# Patient Record
Sex: Female | Born: 1945 | Race: Black or African American | Hispanic: No | Marital: Single | Smoking: Never smoker
Health system: Southern US, Community
[De-identification: ages and names within clinical notes are randomized; demographics above are authoritative.]

## PROBLEM LIST (undated history)

## (undated) DIAGNOSIS — F0390 Unspecified dementia without behavioral disturbance: Secondary | ICD-10-CM

## (undated) DIAGNOSIS — M109 Gout, unspecified: Secondary | ICD-10-CM

## (undated) DIAGNOSIS — I10 Essential (primary) hypertension: Secondary | ICD-10-CM

---

## 1997-11-22 ENCOUNTER — Ambulatory Visit (HOSPITAL_COMMUNITY): Admission: RE | Admit: 1997-11-22 | Discharge: 1997-11-22 | Payer: Self-pay | Admitting: *Deleted

## 1997-12-13 ENCOUNTER — Ambulatory Visit (HOSPITAL_COMMUNITY): Admission: RE | Admit: 1997-12-13 | Discharge: 1997-12-13 | Payer: Self-pay | Admitting: *Deleted

## 1998-03-26 ENCOUNTER — Encounter: Payer: Self-pay | Admitting: Emergency Medicine

## 1998-03-26 ENCOUNTER — Encounter: Payer: Self-pay | Admitting: *Deleted

## 1998-03-26 ENCOUNTER — Inpatient Hospital Stay (HOSPITAL_COMMUNITY): Admission: EM | Admit: 1998-03-26 | Discharge: 1998-03-30 | Payer: Self-pay | Admitting: Emergency Medicine

## 1998-09-19 ENCOUNTER — Ambulatory Visit (HOSPITAL_COMMUNITY): Admission: RE | Admit: 1998-09-19 | Discharge: 1998-09-19 | Payer: Self-pay | Admitting: Family Medicine

## 1998-09-25 ENCOUNTER — Ambulatory Visit (HOSPITAL_COMMUNITY): Admission: RE | Admit: 1998-09-25 | Discharge: 1998-09-25 | Payer: Self-pay | Admitting: Family Medicine

## 1998-09-27 ENCOUNTER — Other Ambulatory Visit: Admission: RE | Admit: 1998-09-27 | Discharge: 1998-09-27 | Payer: Self-pay | Admitting: Family Medicine

## 1999-05-16 ENCOUNTER — Encounter: Payer: Self-pay | Admitting: Family Medicine

## 1999-05-16 ENCOUNTER — Ambulatory Visit (HOSPITAL_COMMUNITY): Admission: RE | Admit: 1999-05-16 | Discharge: 1999-05-16 | Payer: Self-pay | Admitting: Family Medicine

## 2000-11-05 ENCOUNTER — Ambulatory Visit (HOSPITAL_COMMUNITY): Admission: RE | Admit: 2000-11-05 | Discharge: 2000-11-05 | Payer: Self-pay | Admitting: Family Medicine

## 2000-11-05 ENCOUNTER — Encounter: Payer: Self-pay | Admitting: Family Medicine

## 2001-11-10 ENCOUNTER — Encounter: Payer: Self-pay | Admitting: Family Medicine

## 2001-11-10 ENCOUNTER — Encounter: Admission: RE | Admit: 2001-11-10 | Discharge: 2001-11-10 | Payer: Self-pay | Admitting: Family Medicine

## 2002-10-08 ENCOUNTER — Encounter: Admission: RE | Admit: 2002-10-08 | Discharge: 2002-10-08 | Payer: Self-pay | Admitting: Family Medicine

## 2002-10-08 ENCOUNTER — Encounter: Payer: Self-pay | Admitting: Family Medicine

## 2003-03-15 ENCOUNTER — Ambulatory Visit (HOSPITAL_COMMUNITY): Admission: RE | Admit: 2003-03-15 | Discharge: 2003-03-15 | Payer: Self-pay | Admitting: Gastroenterology

## 2003-03-15 ENCOUNTER — Encounter (INDEPENDENT_AMBULATORY_CARE_PROVIDER_SITE_OTHER): Payer: Self-pay | Admitting: *Deleted

## 2004-01-05 ENCOUNTER — Encounter: Admission: RE | Admit: 2004-01-05 | Discharge: 2004-01-05 | Payer: Self-pay | Admitting: Family Medicine

## 2005-04-03 ENCOUNTER — Encounter: Admission: RE | Admit: 2005-04-03 | Discharge: 2005-04-03 | Payer: Self-pay | Admitting: Family Medicine

## 2006-06-03 ENCOUNTER — Encounter: Admission: RE | Admit: 2006-06-03 | Discharge: 2006-06-03 | Payer: Self-pay | Admitting: Family Medicine

## 2006-06-17 ENCOUNTER — Encounter: Admission: RE | Admit: 2006-06-17 | Discharge: 2006-06-17 | Payer: Self-pay | Admitting: Family Medicine

## 2007-09-18 ENCOUNTER — Emergency Department (HOSPITAL_COMMUNITY): Admission: EM | Admit: 2007-09-18 | Discharge: 2007-09-18 | Payer: Self-pay | Admitting: Emergency Medicine

## 2007-09-30 ENCOUNTER — Encounter: Admission: RE | Admit: 2007-09-30 | Discharge: 2007-09-30 | Payer: Self-pay | Admitting: Family Medicine

## 2009-02-08 ENCOUNTER — Encounter: Admission: RE | Admit: 2009-02-08 | Discharge: 2009-02-08 | Payer: Self-pay | Admitting: Family Medicine

## 2009-05-11 ENCOUNTER — Ambulatory Visit (HOSPITAL_COMMUNITY): Admission: RE | Admit: 2009-05-11 | Discharge: 2009-05-11 | Payer: Self-pay | Admitting: Gastroenterology

## 2010-05-27 ENCOUNTER — Encounter: Payer: Self-pay | Admitting: Family Medicine

## 2014-05-26 ENCOUNTER — Emergency Department (HOSPITAL_COMMUNITY)
Admission: EM | Admit: 2014-05-26 | Discharge: 2014-05-26 | Disposition: A | Discharge status: Home / Self Care | Payer: Medicare Other | Attending: Emergency Medicine | Admitting: Emergency Medicine

## 2014-05-26 ENCOUNTER — Encounter (HOSPITAL_COMMUNITY): Payer: Self-pay | Admitting: *Deleted

## 2014-05-26 DIAGNOSIS — L089 Local infection of the skin and subcutaneous tissue, unspecified: Secondary | ICD-10-CM | POA: Diagnosis present

## 2014-05-26 DIAGNOSIS — H6002 Abscess of left external ear: Secondary | ICD-10-CM | POA: Diagnosis not present

## 2014-05-26 MED ORDER — LIDOCAINE HCL 2 % IJ SOLN: 5.0000 mL | Freq: Once | INTRAMUSCULAR | Status: DC

## 2014-05-26 MED ORDER — LIDOCAINE HCL 2 % IJ SOLN
5.0000 mL | Freq: Once | INTRAMUSCULAR | Status: AC
  Administered 2014-05-26: 100 mg
  Filled 2014-05-26: qty 20

## 2014-05-26 MED ORDER — ACETAMINOPHEN 325 MG PO TABS: 650.0000 mg | ORAL_TABLET | Freq: Four times a day (QID) | ORAL | Status: AC | PRN

## 2014-05-26 NOTE — ED Provider Notes (Signed)
CSN: [id number]     Arrival date & time 05/26/14  1104 History   First MD Initiated Contact with Patient 05/26/14 1129     Chief Complaint  Patient presents with  . Wound Infection     (Consider location/radiation/quality/duration/timing/severity/associated sxs/prior Treatment) HPI   69 year old female who was sent [facility] from [facility] for evaluation of possible earlobe infection. Per nursing note, patient has had complaints of pain to her left ear for an unknown amount of time. Nurses notice green pus coming out of of her upper outer ear and therefore patient was sent here for further evaluation. History was difficult to obtain as patient is not a good historian. She does complaining of pus drainage to the affected side and also complaining of ear pain but denies any hearing loss. Unsure of her tetanus status. She denies fever.  History reviewed. No pertinent past medical history. History reviewed. No pertinent past surgical history. No family history on file. History  Substance Use Topics  . Smoking status: Never Smoker   . Smokeless tobacco: Not on file  . Alcohol Use: No   OB History    No data available     Review of Systems  Constitutional: Negative for fever.  HENT: Positive for ear pain. Negative for hearing loss.   Skin: Negative for rash.      Allergies  Review of patient's allergies indicates not on file.  Home Medications   Prior to Admission medications   Not on File   BP 139/75 mmHg  Pulse 79  Temp(Src) 97.5 F (36.4 C) (Oral)  Resp 18  SpO2 95% Physical Exam  Constitutional: She appears well-developed and well-nourished. No distress.  HENT:  Head: Atraumatic.  Left ear: An area of induration and fluctuance noted to the leg of helix with a small punctate area actively oozing out pustular discharge. Normal ear canal with moderate amount of cerumen however TM is intact and normal appearance.  Eyes: Conjunctivae are normal.   Neck: Neck supple.  Neurological: She is alert.  Skin: No rash noted.  Psychiatric: She has a normal mood and affect.  Nursing note and vitals reviewed.   ED Course  Procedures (including critical care time)  12:26 PM Superficial abscess to L ear lobe at leg of helix, successfully I&D by me with relief of sxs.    INCISION AND DRAINAGE Performed by: Fayrene HelperRAN,Alany Borman Consent: Verbal consent obtained. Risks and benefits: risks, benefits and alternatives were discussed Type: abscess  Body area: L ear lobe (leg of helix)  Anesthesia: local infiltration  Incision was made with a scalpel.  Local anesthetic: lidocaine 2% w/o epinephrine  Anesthetic total: 1 ml  Complexity: complex Blunt dissection to break up loculations  Drainage: purulent  Drainage amount: small  Packing material:   Patient tolerance: Patient tolerated the procedure well with no immediate complications.     Labs Review Labs Reviewed - No data to display  Imaging Review No results found.   EKG Interpretation None      MDM   Final diagnoses:  Abscess, earlobe, left    BP 139/75 mmHg  Pulse 79  Temp(Src) 97.5 F (36.4 C) (Oral)  Resp 18  SpO2 95%     Fayrene HelperBowie Santana Edell, PA-C 05/26/14 1305  Tilden FossaElizabeth Rees, MD 05/26/14 1312

## 2014-05-26 NOTE — ED Notes (Signed)
Pt from [facility], came to ED d/t possible infection left ear lobe.

## 2014-05-26 NOTE — ED Notes (Signed)
Note from [facility] states the pt has green pus coming from upper outer ear. Nursing home would like pt evaluated.

## 2014-05-26 NOTE — Discharge Instructions (Signed)
You have an abscess in your left ear lobe.  Please continue to apply warm moist compress to affected region several times daily.  Clean skin with dial antibacterial soap daily.  If your condition worsen, you may need to be seen by your regular doctor for antibiotic.  Take tylenol as needed for pain.   Abscess Care After An abscess (also called a boil or furuncle) is an infected area that contains a collection of pus. Signs and symptoms of an abscess include pain, tenderness, redness, or hardness, or you may feel a moveable soft area under your skin. An abscess can occur anywhere in the body. The infection may spread to surrounding tissues causing cellulitis. A cut (incision) by the surgeon was made over your abscess and the pus was drained out. Gauze may have been packed into the space to provide a drain that will allow the cavity to heal from the inside outwards. The boil may be painful for 5 to 7 days. Most people with a boil do not have high fevers. Your abscess, if seen early, may not have localized, and may not have been lanced. If not, another appointment may be required for this if it does not get better on its own or with medications. HOME CARE INSTRUCTIONS   Only take over-the-counter or prescription medicines for pain, discomfort, or fever as directed by your caregiver.  When you bathe, soak and then remove gauze or iodoform packs at least daily or as directed by your caregiver. You may then wash the wound gently with mild soapy water. Repack with gauze or do as your caregiver directs. SEEK IMMEDIATE MEDICAL CARE IF:   You develop increased pain, swelling, redness, drainage, or bleeding in the wound site.  You develop signs of generalized infection including muscle aches, chills, fever, or a general ill feeling.  An oral temperature above 102 F (38.9 C) develops, not controlled by medication. See your caregiver for a recheck if you develop any of the symptoms described above. If  medications (antibiotics) were prescribed, take them as directed. Document Released: 11/08/2004 Document Revised: 07/15/2011 Document Reviewed: 07/06/2007 [facility] Patient Information 2015 [place]. This information is not intended to replace advice given to you by your health care provider. Make sure you discuss any questions you have with your health care provider.

## 2014-05-26 NOTE — ED Notes (Signed)
Bed: [id number] Expected date:  Expected time:  Means of arrival:  Comments: Elderly, earlobe infection

## 2014-12-07 ENCOUNTER — Emergency Department (HOSPITAL_COMMUNITY): Payer: Medicare Other

## 2014-12-07 ENCOUNTER — Encounter (HOSPITAL_COMMUNITY): Payer: Self-pay | Admitting: Emergency Medicine

## 2014-12-07 ENCOUNTER — Emergency Department (HOSPITAL_COMMUNITY)
Admission: EM | Admit: 2014-12-07 | Discharge: 2014-12-07 | Disposition: A | Discharge status: Home / Self Care | Payer: Medicare Other | Attending: Emergency Medicine | Admitting: Emergency Medicine

## 2014-12-07 DIAGNOSIS — F0390 Unspecified dementia without behavioral disturbance: Secondary | ICD-10-CM | POA: Insufficient documentation

## 2014-12-07 DIAGNOSIS — Z7902 Long term (current) use of antithrombotics/antiplatelets: Secondary | ICD-10-CM | POA: Insufficient documentation

## 2014-12-07 DIAGNOSIS — Z79899 Other long term (current) drug therapy: Secondary | ICD-10-CM | POA: Diagnosis not present

## 2014-12-07 DIAGNOSIS — R109 Unspecified abdominal pain: Secondary | ICD-10-CM | POA: Insufficient documentation

## 2014-12-07 LAB — URINALYSIS, ROUTINE W REFLEX MICROSCOPIC
Bilirubin Urine: NEGATIVE
GLUCOSE, UA: NEGATIVE mg/dL
Hgb urine dipstick: NEGATIVE
KETONES UR: NEGATIVE mg/dL
NITRITE: NEGATIVE
PROTEIN: NEGATIVE mg/dL
SPECIFIC GRAVITY, URINE: 1.011 (ref 1.005–1.030)
Urobilinogen, UA: 0.2 mg/dL (ref 0.0–1.0)
pH: 7 (ref 5.0–8.0)

## 2014-12-07 LAB — CBC WITH DIFFERENTIAL/PLATELET
Basophils Absolute: 0 10*3/uL (ref 0.0–0.1)
Basophils Relative: 0 % (ref 0–1)
EOS ABS: 0.1 10*3/uL (ref 0.0–0.7)
EOS PCT: 2 % (ref 0–5)
HEMATOCRIT: 41.6 % (ref 36.0–46.0)
HEMOGLOBIN: 13.8 g/dL (ref 12.0–15.0)
LYMPHS ABS: 1.4 10*3/uL (ref 0.7–4.0)
Lymphocytes Relative: 30 % (ref 12–46)
MCH: 32.3 pg (ref 26.0–34.0)
MCHC: 33.2 g/dL (ref 30.0–36.0)
MCV: 97.4 fL (ref 78.0–100.0)
MONOS PCT: 10 % (ref 3–12)
Monocytes Absolute: 0.5 10*3/uL (ref 0.1–1.0)
Neutro Abs: 2.8 10*3/uL (ref 1.7–7.7)
Neutrophils Relative %: 58 % (ref 43–77)
Platelets: 171 10*3/uL (ref 150–400)
RBC: 4.27 MIL/uL (ref 3.87–5.11)
RDW: 14.6 % (ref 11.5–15.5)
WBC: 4.8 10*3/uL (ref 4.0–10.5)

## 2014-12-07 LAB — LIPASE, BLOOD: LIPASE: 315 U/L — AB (ref 22–51)

## 2014-12-07 LAB — COMPREHENSIVE METABOLIC PANEL
ALBUMIN: 3.9 g/dL (ref 3.5–5.0)
ALK PHOS: 128 U/L — AB (ref 38–126)
ALT: 19 U/L (ref 14–54)
ANION GAP: 8 (ref 5–15)
AST: 29 U/L (ref 15–41)
BILIRUBIN TOTAL: 0.6 mg/dL (ref 0.3–1.2)
BUN: 29 mg/dL — ABNORMAL HIGH (ref 6–20)
CALCIUM: 9.2 mg/dL (ref 8.9–10.3)
CHLORIDE: 104 mmol/L (ref 101–111)
CO2: 26 mmol/L (ref 22–32)
Creatinine, Ser: 1.11 mg/dL — ABNORMAL HIGH (ref 0.44–1.00)
GFR calc Af Amer: 57 mL/min — ABNORMAL LOW (ref >60)
GFR calc non Af Amer: 49 mL/min — ABNORMAL LOW (ref >60)
Glucose, Bld: 76 mg/dL (ref 65–99)
Potassium: 4 mmol/L (ref 3.5–5.1)
Sodium: 138 mmol/L (ref 135–145)
TOTAL PROTEIN: 6.9 g/dL (ref 6.5–8.1)

## 2014-12-07 LAB — URINE MICROSCOPIC-ADD ON

## 2014-12-07 NOTE — ED Provider Notes (Signed)
CSN: [id number]     Arrival date & time 12/07/14  1712 History   First MD Initiated Contact with Patient 12/07/14 1728     Chief Complaint  Patient presents with  . Abdominal Pain      HPI  Patient presents evaluation of possible headache, and possible abdominal pain. Patient's history of dementia. Resides today would like place memory care unit. Her power of attorney that accompanies her here received a call today initially she had had a headache. Didn't receive another call that she was "not herself" might be having abdominal pain".  On arrival here, patient complains only that her "eyes water a lot". By mouth he states that this is a constant complaint for her.  History reviewed. No pertinent past medical history. History reviewed. No pertinent past surgical history. No family history on file. History  Substance Use Topics  . Smoking status: Never Smoker   . Smokeless tobacco: Not on file  . Alcohol Use: No   OB History    No data available     Review of Systems  Unable to perform ROS: Dementia      Allergies  Review of patient's allergies indicates no known allergies.  Home Medications   Prior to Admission medications   Medication Sig Start Date End Date Taking? Authorizing Provider  amLODipine (NORVASC) 5 MG tablet Take 5 mg by mouth daily.   Yes Historical Provider, MD  cetaphil (CETAPHIL) cream Apply 1 application topically as needed (dry skin).   Yes Historical Provider, MD  clopidogrel (PLAVIX) 75 MG tablet Take 75 mg by mouth daily.   Yes Historical Provider, MD  dexlansoprazole (DEXILANT) 60 MG capsule Take 60 mg by mouth daily.   Yes Historical Provider, MD  fluocinonide cream (LIDEX) 0.05 % Apply 1 application topically 3 (three) times daily.   Yes Historical Provider, MD  losartan (COZAAR) 50 MG tablet Take 50 mg by mouth daily.   Yes Historical Provider, MD  memantine (NAMENDA) 10 MG tablet Take 10 mg by mouth daily.   Yes Historical Provider, MD   metFORMIN (GLUCOPHAGE) 500 MG tablet Take 500 mg by mouth at bedtime.   Yes Historical Provider, MD  QUEtiapine (SEROQUEL) 25 MG tablet Take 25 mg by mouth at bedtime.   Yes Historical Provider, MD  traMADol (ULTRAM) 50 MG tablet Take 50 mg by mouth every 6 (six) hours as needed for moderate pain.   Yes Historical Provider, MD  triamcinolone cream (KENALOG) 0.1 % Apply 1 application topically every 12 (twelve) hours as needed (dry skin).   Yes Historical Provider, MD  acetaminophen (TYLENOL) 325 MG tablet Take 2 tablets (650 mg total) by mouth every 6 (six) hours as needed for moderate pain or fever. Patient not taking: Reported on 12/07/2014 05/26/14   Fayrene Helper, PA-C   BP 125/78 mmHg  Pulse 79  Temp(Src) 98.9 F (37.2 C) (Oral)  Resp 16  SpO2 97% Physical Exam  Constitutional: She is oriented to person, place, and time. She appears well-developed and well-nourished. No distress.  HENT:  Head: Normocephalic.  Eyes: Conjunctivae are normal. Pupils are equal, round, and reactive to light. No scleral icterus.  Neck: Normal range of motion. Neck supple. No thyromegaly present.  Cardiovascular: Normal rate and regular rhythm.  Exam reveals no gallop and no friction rub.   No murmur heard. Pulmonary/Chest: Effort normal and breath sounds normal. No respiratory distress. She has no wheezes. She has no rales.  Abdominal: Soft. Bowel sounds are normal. She exhibits  no distension. There is no tenderness. There is no rebound.  Soft benign abdomen. No guarding rebound. Particular no left upper abdomen or epigastric pain.  Musculoskeletal: Normal range of motion.  Neurological: She is alert and oriented to person, place, and time.  Skin: Skin is warm and dry. No rash noted.  Psychiatric: She has a normal mood and affect. Her behavior is normal.    ED Course  Procedures (including critical care time) Labs Review Labs Reviewed  COMPREHENSIVE METABOLIC PANEL - Abnormal; Notable for the following:     BUN 29 (*)    Creatinine, Ser 1.11 (*)    Alkaline Phosphatase 128 (*)    GFR calc non Af Amer 49 (*)    GFR calc Af Amer 57 (*)    All other components within normal limits  LIPASE, BLOOD - Abnormal; Notable for the following:    Lipase 315 (*)    All other components within normal limits  URINALYSIS, ROUTINE W REFLEX MICROSCOPIC (NOT AT [facility]) - Abnormal; Notable for the following:    Leukocytes, UA MODERATE (*)    All other components within normal limits  CBC WITH DIFFERENTIAL/PLATELET  URINE MICROSCOPIC-ADD ON    Imaging Review Ct Head Wo Contrast  12/07/2014   CLINICAL DATA:  Alzheimer's disease.  RIGHT flank pain.  EXAM: CT HEAD WITHOUT CONTRAST  TECHNIQUE: Contiguous axial images were obtained from the base of the skull through the vertex without intravenous contrast.  COMPARISON:  None.  FINDINGS: No mass lesion, mass effect, midline shift, hydrocephalus, hemorrhage. No acute territorial cortical ischemia/infarct. Atrophy and chronic ischemic white matter disease is present. Motion artifact is present on the most inferior slice. Benign basal ganglia calcifications are present.  IMPRESSION: Atrophy and chronic ischemic white matter disease without acute intracranial abnormality.   Electronically Signed   By: Andreas Newport M.D.   On: 12/07/2014 19:37   Ct Renal Stone Study  12/07/2014   CLINICAL DATA:  Left-sided abdominal pain. History of Alzheimer's disease.  EXAM: CT ABDOMEN AND PELVIS WITHOUT CONTRAST  TECHNIQUE: Multidetector CT imaging of the abdomen and pelvis was performed following the standard protocol without IV contrast.  COMPARISON:  02/08/2009  FINDINGS: Lung bases demonstrate minimal dependent atelectatic change. Calcified plaque is present over the left main, left anterior descending and lateral circumflex coronary arteries.  Abdominal images demonstrate a tiny calcified granuloma over the right lobe of the liver. The spleen, pancreas, gallbladder and adrenal glands are  within normal.  Kidneys are normal in size without hydronephrosis or nephrolithiasis. Ureters are within normal.  Appendix is within normal.  There is moderate calcified plaque over the abdominal aorta.  There is mild fecal retention throughout the colon. Small bowel is within normal. There is no free fluid or inflammatory change.  Pelvic images demonstrate the bladder and rectum to be within normal. There are multiple phleboliths present. No free fluid.  There are degenerative changes of the spine and hips. There is stable sclerosis with prominent trabecular a throughout the entire L5 vertebral body and posterior elements suggesting Paget's disease. Similar changes are present with more prominent sclerosis involving L4, new versus moderate progression compared to the preop prior study.  IMPRESSION: No acute findings in the abdomen/ pelvis.  Mild fecal retention throughout the colon without evidence of obstruction.  Stable changes involving the L5 vertebral body with progression of similar changes involving the L4 vertebral body most suggestive of Paget's disease.  Atherosclerotic coronary artery disease.   Electronically Signed  By: Elberta Fortis M.D.   On: 12/07/2014 19:47     EKG Interpretation None      MDM   Final diagnoses:  Flank pain  Abdominal pain, unspecified abdominal location    Patient with benign abdomen. Requesting by mouth and taking by mouth without difficulty here. CT with no signs of pancreatitis. I discussed the elevation of her lipase with her provider that is accompanying her here. I think she is appropriate for discharge back to her facility. If she has complained of anorexia, nausea vomiting, worsening pain, fever, or other change in bastard to be reevaluated here. Clinically sided for morning. Cancer diet and tolerating.    Rolland Porter, MD 12/07/14 [phone]

## 2014-12-07 NOTE — ED Notes (Signed)
Bed: WU98 Expected date:  Expected time:  Means of arrival:  Comments: HA

## 2014-12-07 NOTE — Discharge Instructions (Signed)
Clear liquids only tonight and tomorrow morning. Slowly advance diet if tolerated. Recheck here with fever, abdominal pain, vomiting, or other worsening symptoms. Labs suggest a mild pancreatitis. However, CT scan does not show evidence of pancreatitis.  This may be simple lab abnormality. Abdominal Pain Many things can cause abdominal pain. Usually, abdominal pain is not caused by a disease and will improve without treatment. It can often be observed and treated at home. Your health care provider will do a physical exam and possibly order blood tests and X-rays to help determine the seriousness of your pain. However, in many cases, more time must pass before a clear cause of the pain can be found. Before that point, your health care provider may not know if you need more testing or further treatment. HOME CARE INSTRUCTIONS  Monitor your abdominal pain for any changes. The following actions may help to alleviate any discomfort you are experiencing:  Only take over-the-counter or prescription medicines as directed by your health care provider.  Do not take laxatives unless directed to do so by your health care provider.  Try a clear liquid diet (broth, tea, or water) as directed by your health care provider. Slowly move to a bland diet as tolerated. SEEK MEDICAL CARE IF:  You have unexplained abdominal pain.  You have abdominal pain associated with nausea or diarrhea.  You have pain when you urinate or have a bowel movement.  You experience abdominal pain that wakes you in the night.  You have abdominal pain that is worsened or improved by eating food.  You have abdominal pain that is worsened with eating fatty foods.  You have a fever. SEEK IMMEDIATE MEDICAL CARE IF:   Your pain does not go away within 2 hours.  You keep throwing up (vomiting).  Your pain is felt only in portions of the abdomen, such as the right side or the left lower portion of the abdomen.  You pass bloody or  black tarry stools. MAKE SURE YOU:  Understand these instructions.   Will watch your condition.   Will get help right away if you are not doing well or get worse.  Document Released: 01/30/2005 Document Revised: 04/27/2013 Document Reviewed: 12/30/2012 [facility] Patient Information 2015 [place], [place]. This information is not intended to replace advice given to you by your health care provider. Make sure you discuss any questions you have with your health care provider.

## 2014-12-07 NOTE — ED Notes (Signed)
Patient here from [facility] with complaints of left sided abd pain. Hx of Alzheimer's. Denies n/v/d.

## 2014-12-07 NOTE — Progress Notes (Signed)
Pt doctor assigned by Sandi Mariscal access is [facility] - Dr Amada Kingfisher bonsu EPIC updated

## 2014-12-07 NOTE — ED Notes (Signed)
Please call Burna Mortimer, pt's POA with test's results.  [phone]

## 2015-07-13 ENCOUNTER — Emergency Department (HOSPITAL_COMMUNITY)
Admission: EM | Admit: 2015-07-13 | Discharge: 2015-07-13 | Disposition: A | Discharge status: Home / Self Care | Payer: Medicare Other | Attending: Emergency Medicine | Admitting: Emergency Medicine

## 2015-07-13 ENCOUNTER — Emergency Department (HOSPITAL_COMMUNITY): Payer: Medicare Other

## 2015-07-13 ENCOUNTER — Encounter (HOSPITAL_COMMUNITY): Payer: Self-pay | Admitting: Emergency Medicine

## 2015-07-13 DIAGNOSIS — J069 Acute upper respiratory infection, unspecified: Secondary | ICD-10-CM | POA: Insufficient documentation

## 2015-07-13 DIAGNOSIS — H578 Other specified disorders of eye and adnexa: Secondary | ICD-10-CM | POA: Diagnosis present

## 2015-07-13 DIAGNOSIS — R109 Unspecified abdominal pain: Secondary | ICD-10-CM | POA: Insufficient documentation

## 2015-07-13 DIAGNOSIS — I10 Essential (primary) hypertension: Secondary | ICD-10-CM | POA: Diagnosis not present

## 2015-07-13 DIAGNOSIS — Z79899 Other long term (current) drug therapy: Secondary | ICD-10-CM | POA: Insufficient documentation

## 2015-07-13 DIAGNOSIS — Z7982 Long term (current) use of aspirin: Secondary | ICD-10-CM | POA: Diagnosis not present

## 2015-07-13 DIAGNOSIS — H109 Unspecified conjunctivitis: Secondary | ICD-10-CM | POA: Diagnosis not present

## 2015-07-13 DIAGNOSIS — R4182 Altered mental status, unspecified: Secondary | ICD-10-CM | POA: Insufficient documentation

## 2015-07-13 HISTORY — DX: Essential (primary) hypertension: I10

## 2015-07-13 LAB — DIFFERENTIAL
BASOS ABS: 0 10*3/uL (ref 0.0–0.1)
BASOS PCT: 0 %
EOS ABS: 0 10*3/uL (ref 0.0–0.7)
Eosinophils Relative: 0 %
Lymphocytes Relative: 19 %
Lymphs Abs: 0.9 10*3/uL (ref 0.7–4.0)
MONOS PCT: 13 %
Monocytes Absolute: 0.6 10*3/uL (ref 0.1–1.0)
NEUTROS PCT: 68 %
Neutro Abs: 3.2 10*3/uL (ref 1.7–7.7)

## 2015-07-13 LAB — COMPREHENSIVE METABOLIC PANEL
ALK PHOS: 98 U/L (ref 38–126)
ALT: 36 U/L (ref 14–54)
ANION GAP: 15 (ref 5–15)
AST: 56 U/L — ABNORMAL HIGH (ref 15–41)
Albumin: 4.7 g/dL (ref 3.5–5.0)
BILIRUBIN TOTAL: 0.8 mg/dL (ref 0.3–1.2)
BUN: 27 mg/dL — ABNORMAL HIGH (ref 6–20)
CALCIUM: 10.1 mg/dL (ref 8.9–10.3)
CO2: 30 mmol/L (ref 22–32)
Chloride: 96 mmol/L — ABNORMAL LOW (ref 101–111)
Creatinine, Ser: 1.04 mg/dL — ABNORMAL HIGH (ref 0.44–1.00)
GFR calc non Af Amer: 53 mL/min — ABNORMAL LOW (ref >60)
Glucose, Bld: 89 mg/dL (ref 65–99)
Potassium: 3.9 mmol/L (ref 3.5–5.1)
SODIUM: 141 mmol/L (ref 135–145)
TOTAL PROTEIN: 8.7 g/dL — AB (ref 6.5–8.1)

## 2015-07-13 LAB — URINALYSIS, ROUTINE W REFLEX MICROSCOPIC
Bilirubin Urine: NEGATIVE
Glucose, UA: NEGATIVE mg/dL
Ketones, ur: NEGATIVE mg/dL
LEUKOCYTES UA: NEGATIVE
Nitrite: NEGATIVE
PROTEIN: 30 mg/dL — AB
Specific Gravity, Urine: 1.013 (ref 1.005–1.030)
pH: 6 (ref 5.0–8.0)

## 2015-07-13 LAB — CBG MONITORING, ED
GLUCOSE-CAPILLARY: 75 mg/dL (ref 65–99)
GLUCOSE-CAPILLARY: 83 mg/dL (ref 65–99)

## 2015-07-13 LAB — URINE MICROSCOPIC-ADD ON: Bacteria, UA: NONE SEEN

## 2015-07-13 LAB — CBC
HCT: 44.6 % (ref 36.0–46.0)
HEMOGLOBIN: 15.3 g/dL — AB (ref 12.0–15.0)
MCH: 32.2 pg (ref 26.0–34.0)
MCHC: 34.3 g/dL (ref 30.0–36.0)
MCV: 93.9 fL (ref 78.0–100.0)
Platelets: 158 10*3/uL (ref 150–400)
RBC: 4.75 MIL/uL (ref 3.87–5.11)
RDW: 14.3 % (ref 11.5–15.5)
WBC: 4.7 10*3/uL (ref 4.0–10.5)

## 2015-07-13 LAB — TROPONIN I: TROPONIN I: 0.03 ng/mL (ref <0.031)

## 2015-07-13 LAB — I-STAT CG4 LACTIC ACID, ED: Lactic Acid, Venous: 1.53 mmol/L (ref 0.5–2.0)

## 2015-07-13 LAB — BRAIN NATRIURETIC PEPTIDE: B Natriuretic Peptide: 35 pg/mL (ref 0.0–100.0)

## 2015-07-13 MED ORDER — POLYMYXIN B-TRIMETHOPRIM 10000-0.1 UNIT/ML-% OP SOLN
1.0000 [drp] | OPHTHALMIC | Status: DC
  Administered 2015-07-13: 1 [drp] via OPHTHALMIC
  Filled 2015-07-13: qty 10

## 2015-07-13 MED ORDER — POLYMYXIN B-TRIMETHOPRIM 10000-0.1 UNIT/ML-% OP SOLN: 1.0000 [drp] | OPHTHALMIC | Status: AC

## 2015-07-13 NOTE — Discharge Instructions (Signed)
Upper Respiratory Infection, Adult °Most upper respiratory infections (URIs) are a viral infection of the air passages leading to the lungs. A URI affects the nose, throat, and upper air passages. The most common type of URI is nasopharyngitis and is typically referred to as "the common cold." °URIs run their course and usually go away on their own. Most of the time, a URI does not require medical attention, but sometimes a bacterial infection in the upper airways can follow a viral infection. This is called a secondary infection. Sinus and middle ear infections are common types of secondary upper respiratory infections. °Bacterial pneumonia can also complicate a URI. A URI can worsen asthma and chronic obstructive pulmonary disease (COPD). Sometimes, these complications can require emergency medical care and may be life threatening.  °CAUSES °Almost all URIs are caused by viruses. A virus is a type of germ and can spread from one person to another.  °RISKS FACTORS °You may be at risk for a URI if:  °· You smoke.   °· You have chronic heart or lung disease. °· You have a weakened defense (immune) system.   °· You are very young or very old.   °· You have nasal allergies or asthma. °· You work in crowded or poorly ventilated areas. °· You work in health care facilities or schools. °SIGNS AND SYMPTOMS  °Symptoms typically develop 2-3 days after you come in contact with a cold virus. Most viral URIs last 7-10 days. However, viral URIs from the influenza virus (flu virus) can last 14-18 days and are typically more severe. Symptoms may include:  °· Runny or stuffy (congested) nose.   °· Sneezing.   °· Cough.   °· Sore throat.   °· Headache.   °· Fatigue.   °· Fever.   °· Loss of appetite.   °· Pain in your forehead, behind your eyes, and over your cheekbones (sinus pain). °· Muscle aches.   °DIAGNOSIS  °Your health care provider may diagnose a URI by: °· Physical exam. °· Tests to check that your symptoms are not due to  another condition such as: °· Strep throat. °· Sinusitis. °· Pneumonia. °· Asthma. °TREATMENT  °A URI goes away on its own with time. It cannot be cured with medicines, but medicines may be prescribed or recommended to relieve symptoms. Medicines may help: °· Reduce your fever. °· Reduce your cough. °· Relieve nasal congestion. °HOME CARE INSTRUCTIONS  °· Take medicines only as directed by your health care provider.   °· Gargle warm saltwater or take cough drops to comfort your throat as directed by your health care provider. °· Use a warm mist humidifier or inhale steam from a shower to increase air moisture. This may make it easier to breathe. °· Drink enough fluid to keep your urine clear or pale yellow.   °· Eat soups and other clear broths and maintain good nutrition.   °· Rest as needed.   °· Return to work when your temperature has returned to normal or as your health care provider advises. You may need to stay home longer to avoid infecting others. You can also use a face mask and careful hand washing to prevent spread of the virus. °· Increase the usage of your inhaler if you have asthma.   °· Do not use any tobacco products, including cigarettes, chewing tobacco, or electronic cigarettes. If you need help quitting, ask your health care provider. °PREVENTION  °The best way to protect yourself from getting a cold is to practice good hygiene.  °· Avoid oral or hand contact with people with cold   use any tobacco products, including cigarettes, chewing tobacco, or electronic cigarettes. If you need help quitting, ask your health care provider.  PREVENTION   The best way to protect yourself from getting a cold is to practice good hygiene.   · Avoid oral or hand contact with people with cold symptoms.    · Wash your hands often if contact occurs.    There is no clear evidence that vitamin C, vitamin E, echinacea, or exercise reduces the chance of developing a cold. However, it is always recommended to get plenty of rest, exercise, and practice good nutrition.   SEEK MEDICAL CARE IF:   · You are getting worse rather than better.    · Your symptoms are not controlled by medicine.    · You have chills.  · You have worsening shortness of breath.  · You have brown or red mucus.  · You have yellow or brown nasal  discharge.  · You have pain in your face, especially when you bend forward.  · You have a fever.  · You have swollen neck glands.  · You have pain while swallowing.  · You have white areas in the back of your throat.  SEEK IMMEDIATE MEDICAL CARE IF:   · You have severe or persistent:    Headache.    Ear pain.    Sinus pain.    Chest pain.  · You have chronic lung disease and any of the following:    Wheezing.    Prolonged cough.    Coughing up blood.    A change in your usual mucus.  · You have a stiff neck.  · You have changes in your:    Vision.    Hearing.    Thinking.    Mood.  MAKE SURE YOU:   · Understand these instructions.  · Will watch your condition.  · Will get help right away if you are not doing well or get worse.     This information is not intended to replace advice given to you by your health care provider. Make sure you discuss any questions you have with your health care provider.     Document Released: 10/16/2000 Document Revised: 09/06/2014 Document Reviewed: 07/28/2013  Elsevier Interactive Patient Education ©2016 Elsevier Inc[employer]      Bacterial Conjunctivitis  Bacterial conjunctivitis, commonly called pink eye, is an inflammation of the clear membrane that covers the white part of the eye (conjunctiva). The inflammation can also happen on the underside of the eyelids. The blood vessels in the conjunctiva become inflamed, causing the eye to become red or pink. Bacterial conjunctivitis may spread easily from one eye to another and from person to person (contagious).   CAUSES   Bacterial conjunctivitis is caused by bacteria. The bacteria may come from your own skin, your upper respiratory tract, or from someone else with bacterial conjunctivitis.  SYMPTOMS   The normally white color of the eye or the underside of the eyelid is usually pink or red. The pink eye is usually associated with irritation, tearing, and some sensitivity to light. Bacterial conjunctivitis is often associated with a thick,  yellowish discharge from the eye. The discharge may turn into a crust on the eyelids overnight, which causes your eyelids to stick together. If a discharge is present, there may also be some blurred vision in the affected eye.  DIAGNOSIS   Bacterial conjunctivitis is diagnosed by your caregiver through an eye exam and the symptoms that you report. Your caregiver looks for   may ease discomfort. HOME CARE INSTRUCTIONS   To ease discomfort, apply a cool, clean washcloth to your eye for 10-20 minutes, 3-4 times a day.  Gently wipe away any drainage from your eye with a warm, wet washcloth or a cotton ball.  Wash your hands often with soap and water. Use paper towels to dry your hands.  Do not share towels or washcloths. This may spread the infection.  Change or wash your pillowcase every day.  You should not use eye makeup until the infection is gone.  Do not operate machinery or drive if your vision is blurred.  Stop using contact lenses. Ask your caregiver how to sterilize or replace your contacts before using them again. This depends on the type of contact lenses that you use.  When applying medicine to the infected eye, do not touch the edge of your eyelid with the eyedrop bottle or ointment tube. SEEK  IMMEDIATE MEDICAL CARE IF:   Your infection has not improved within 3 days after beginning treatment.  You had yellow discharge from your eye and it returns.  You have increased eye pain.  Your eye redness is spreading.  Your vision becomes blurred.  You have a fever or persistent symptoms for more than 2-3 days.  You have a fever and your symptoms suddenly get worse.  You have facial pain, redness, or swelling. MAKE SURE YOU:   Understand these instructions.  Will watch your condition.  Will get help right away if you are not doing well or get worse.   This information is not intended to replace advice given to you by your health care provider. Make sure you discuss any questions you have with your health care provider.   Document Released: 04/22/2005 Document Revised: 05/13/2014 Document Reviewed: 09/23/2011 Elsevier Interactive Patient Education [employer]2016 Elsevier Inc.

## 2015-07-13 NOTE — ED Notes (Signed)
[facility] pt from [facility], per staff pt has altered mental status  , incontinent which is abnormal for this pt, also report hypertension 160/122 by ems. Pt reports to EMS pressure pointing to her face.

## 2015-07-13 NOTE — ED Notes (Signed)
Bed: WA11 Expected date:  Expected time:  Means of arrival:  Comments: EMS 

## 2015-07-13 NOTE — ED Provider Notes (Signed)
CSN: [id number]     Arrival date & time 07/13/15  1527 History   First MD Initiated Contact with Patient 07/13/15 1531     Chief Complaint  Patient presents with  . Altered Mental Status  . Hypertension     (Consider location/radiation/quality/duration/timing/severity/associated sxs/prior Treatment) HPI Very limited history at this time. EMS report is for mental status change. Reportedly patient was incontinent of urine which is atypical. Also report of hypertension. Patient is alert and pleasant but seems confused. She indicates they had "gotten her ready" yesterday evening. She is not sure why she was sent to the hospital. She thinks they apparently thought she needed to be here. She reports that she wakes up with pain somewhere every day. She cannot localize any pain at this time. She does identify that her eyes have been watery and itchy. Past Medical History  Diagnosis Date  . Hypertension    History reviewed. No pertinent past surgical history. No family history on file. Social History  Substance Use Topics  . Smoking status: Never Smoker   . Smokeless tobacco: None  . Alcohol Use: No   OB History    No data available     Review of Systems  Cannot obtain review of systems level V caveat dementia or confusion.  Allergies  Review of patient's allergies indicates no known allergies.  Home Medications   Prior to Admission medications   Medication Sig Start Date End Date Taking? Authorizing Provider  acetaminophen (TYLENOL) 325 MG tablet Take 2 tablets (650 mg total) by mouth every 6 (six) hours as needed for moderate pain or fever. 05/26/14  Yes Fayrene HelperBowie Tran, PA-C  allopurinol (ZYLOPRIM) 100 MG tablet Take 100 mg by mouth daily.   Yes Historical Provider, MD  aspirin 81 MG chewable tablet Chew 81 mg by mouth daily.   Yes Historical Provider, MD  atorvastatin (LIPITOR) 40 MG tablet Take 40 mg by mouth daily.   Yes Historical Provider, MD  LORazepam (ATIVAN) 0.5 MG tablet Take  0.5 mg by mouth 2 (two) times daily as needed for anxiety.   Yes Historical Provider, MD  trimethoprim-polymyxin b (POLYTRIM) ophthalmic solution Place 1 drop into both eyes every 4 (four) hours. 07/13/15 07/20/15  Arby BarretteMarcy Debbie Yearick, MD   BP 154/88 mmHg  Pulse 113  Temp(Src) 98 F (36.7 C) (Oral)  Resp 17  SpO2 98% Physical Exam  Constitutional: She appears well-developed and well-nourished.  Patient was sleeping quietly as I walk in the room. She awakened to light voice and was cheerful and cooperative. No respiratory distress. Mild confusion.  HENT:  Head: Normocephalic and atraumatic.  Mouth/Throat: Oropharynx is clear and moist.  Patient has clear nasal drainage.  Eyes: EOM are normal. Pupils are equal, round, and reactive to light.  Bilateral sclera are mildly injected. No active tearing. arcus senilis.  Neck: Neck supple.  Cardiovascular: Normal rate, regular rhythm, normal heart sounds and intact distal pulses.   Pulmonary/Chest: Effort normal.  The patient is cooperative but not taking extensively deep breath. Seems to be decreased breath sounds in the right base. No gross wheeze or rhonchi.  Abdominal: Soft. Bowel sounds are normal. She exhibits no distension. There is tenderness.  Patient generally endorses tenderness to palpation of the abdomen. She however does not localizes anywhere and there is no guarding or perceptible expression of pain that would localize.  Musculoskeletal: Normal range of motion. She exhibits no edema or tenderness.  Bilateral lower extremities are good condition no peripheral edema. No wounds  on the feet no cellulitis.  Neurological: She is alert. She has normal strength. Coordination normal. GCS eye subscore is 4. GCS verbal subscore is 5. GCS motor subscore is 6.  Patient is cheerful and alert. She is not a good historian.  Skin: Skin is warm, dry and intact. No rash noted.  Psychiatric: She has a normal mood and affect.    ED Course  Procedures  (including critical care time) Labs Review Labs Reviewed  COMPREHENSIVE METABOLIC PANEL - Abnormal; Notable for the following:    Chloride 96 (*)    BUN 27 (*)    Creatinine, Ser 1.04 (*)    Total Protein 8.7 (*)    AST 56 (*)    GFR calc non Af Amer 53 (*)    All other components within normal limits  CBC - Abnormal; Notable for the following:    Hemoglobin 15.3 (*)    All other components within normal limits  URINALYSIS, ROUTINE W REFLEX MICROSCOPIC (NOT AT [facility]) - Abnormal; Notable for the following:    Hgb urine dipstick MODERATE (*)    Protein, ur 30 (*)    All other components within normal limits  URINE MICROSCOPIC-ADD ON - Abnormal; Notable for the following:    Squamous Epithelial / LPF 0-5 (*)    All other components within normal limits  CULTURE, BLOOD (ROUTINE X 2)  CULTURE, BLOOD (ROUTINE X 2)  TROPONIN I  BRAIN NATRIURETIC PEPTIDE  DIFFERENTIAL  CBG MONITORING, ED  I-STAT CG4 LACTIC ACID, ED  CBG MONITORING, ED  I-STAT CG4 LACTIC ACID, ED    Imaging Review Dg Chest 2 View  07/13/2015  CLINICAL DATA:  When asked if in pain pt pointing to center chest. Per [facility] pt from [facility], per staff pt has altered mental status , incontinent which is abnormal for this pt, also report hypertension. Non-smoker. EXAM: CHEST  2 VIEW COMPARISON:  Report from 10/08/2002 FINDINGS: Reverse lordotic projection. The patient is rotated to the bright on today's radiograph, reducing diagnostic sensitivity and specificity. Heart size within normal limits.  Low lung volumes. The lungs appear clear. Lateral projection is oblique 10 off axis and accordingly has reduced sensitivity and specificity. Thoracic spondylosis. IMPRESSION: 1. Thoracic spondylosis. 2. Low lung volumes are present, causing crowding of the pulmonary vasculature. 3. No acute findings. There is some reduction in sensitivity due to obliquity during imaging. Electronically Signed   By: Gaylyn Rong M.D.   On:  07/13/2015 17:18   I have personally reviewed and evaluated these images and lab results as part of my medical decision-making.   EKG Interpretation None      MDM   Final diagnoses:  URI, acute  Bilateral conjunctivitis   Diagnostic workup does not indicate acute source of infection. Chest x-ray is without pneumonia and UA is negative. Labs are otherwise within normal limits. On physical examination the patient does objectively have bilateral conjunctivitis and clear nasal discharge. Physical exam findings are consistent with a URI. At this time she will be given drops for conjunctivitis and recommended observation at [facility] for any changing or worsening symptoms.    Arby Barrette, MD 07/13/15 2048

## 2015-07-15 ENCOUNTER — Encounter (HOSPITAL_COMMUNITY): Payer: Self-pay | Admitting: Emergency Medicine

## 2015-07-15 ENCOUNTER — Emergency Department (HOSPITAL_COMMUNITY)
Admission: EM | Admit: 2015-07-15 | Discharge: 2015-07-15 | Disposition: A | Discharge status: Home / Self Care | Payer: Medicare Other | Attending: Emergency Medicine | Admitting: Emergency Medicine

## 2015-07-15 DIAGNOSIS — H04203 Unspecified epiphora, bilateral lacrimal glands: Secondary | ICD-10-CM

## 2015-07-15 DIAGNOSIS — H578 Other specified disorders of eye and adnexa: Secondary | ICD-10-CM | POA: Diagnosis present

## 2015-07-15 DIAGNOSIS — I10 Essential (primary) hypertension: Secondary | ICD-10-CM | POA: Insufficient documentation

## 2015-07-15 DIAGNOSIS — Z79899 Other long term (current) drug therapy: Secondary | ICD-10-CM | POA: Diagnosis not present

## 2015-07-15 DIAGNOSIS — R55 Syncope and collapse: Secondary | ICD-10-CM | POA: Insufficient documentation

## 2015-07-15 DIAGNOSIS — Z7982 Long term (current) use of aspirin: Secondary | ICD-10-CM | POA: Diagnosis not present

## 2015-07-15 DIAGNOSIS — Z792 Long term (current) use of antibiotics: Secondary | ICD-10-CM | POA: Insufficient documentation

## 2015-07-15 DIAGNOSIS — R404 Transient alteration of awareness: Secondary | ICD-10-CM

## 2015-07-15 DIAGNOSIS — F0390 Unspecified dementia without behavioral disturbance: Secondary | ICD-10-CM

## 2015-07-15 DIAGNOSIS — R4189 Other symptoms and signs involving cognitive functions and awareness: Secondary | ICD-10-CM

## 2015-07-15 LAB — I-STAT CHEM 8, ED
BUN: 45 mg/dL — ABNORMAL HIGH (ref 6–20)
CALCIUM ION: 1.06 mmol/L — AB (ref 1.13–1.30)
Chloride: 99 mmol/L — ABNORMAL LOW (ref 101–111)
Creatinine, Ser: 1.5 mg/dL — ABNORMAL HIGH (ref 0.44–1.00)
GLUCOSE: 81 mg/dL (ref 65–99)
HCT: 42 % (ref 36.0–46.0)
HEMOGLOBIN: 14.3 g/dL (ref 12.0–15.0)
POTASSIUM: 3.3 mmol/L — AB (ref 3.5–5.1)
Sodium: 138 mmol/L (ref 135–145)
TCO2: 27 mmol/L (ref 0–100)

## 2015-07-15 MED ORDER — POTASSIUM CHLORIDE CRYS ER 20 MEQ PO TBCR
40.0000 meq | EXTENDED_RELEASE_TABLET | Freq: Once | ORAL | Status: AC
  Administered 2015-07-15: 40 meq via ORAL
  Filled 2015-07-15: qty 2

## 2015-07-15 NOTE — ED Notes (Signed)
Bed: WA17 Expected date:  Expected time:  Means of arrival:  Comments: EMS 

## 2015-07-15 NOTE — ED Notes (Signed)
Upon assessment pt complaint of continued eye watering worse to left.

## 2015-07-15 NOTE — ED Notes (Addendum)
Per EMS called out as result of pt not responding to staff when name called upon attempt to awaken pt; with EMS arrival per normal with complaint of watery eyes; seen here for same 07/13/14; pt hx of dementia alert to self per normal.

## 2015-07-15 NOTE — ED Provider Notes (Signed)
CSN: [id number]     Arrival date & time 07/15/15  0734 History   First MD Initiated Contact with Patient 07/15/15 0744     Chief Complaint  Patient presents with  . Watery Eyes      (Consider location/radiation/quality/duration/timing/severity/associated sxs/prior Treatment) HPI 70 year old female with a history of hypertension and dementia presents with concern for possible episode of unresponsiveness. Per EMS, they had been called out for unresponsiveness, however on their arrival the facility had reported watery eyes.  I called the facility and spoke with the nurse, had received report that the patient had an episode of unresponsiveness and was transferred to the emergency department.  It was not clear how long this episode lasted, or the quality of the episode per notes or nursing hand-off.  Patient's pseudo-daughter/POA reports she will sometimes ignore people if she doesn't want to wake up.  Pt is currently at her baseline per family. The facility denies any falls, and patient has no signs of trauma on exam. Pt denies concerns except for fall, watery eyes.  Pt reports she was laying on the ground for 30 minutes and unable to get up. Pt denies pain.  Facility denies any falls.  It is  Unclear how long episode of unresponsiveness lasted per facility, however was this AM and had resolved prior to EMS arrival.  Pt has reported falls in the fast, frequently reports watery eyes per family.    Past Medical History  Diagnosis Date  . Hypertension    History reviewed. No pertinent past surgical history. No family history on file. Social History  Substance Use Topics  . Smoking status: Never Smoker   . Smokeless tobacco: None  . Alcohol Use: No   OB History    No data available     Review of Systems  Unable to perform ROS: Dementia  Constitutional: Negative for fever and appetite change.  Eyes: Positive for discharge (watery eyes (on eye drops)).  Respiratory: Negative for shortness of  breath.   Cardiovascular: Negative for chest pain.  Gastrointestinal: Negative for vomiting.  Genitourinary: Negative for dysuria.  Skin: Negative for rash.  Neurological: Negative for headaches.      Allergies  Review of patient's allergies indicates no known allergies.  Home Medications   Prior to Admission medications   Medication Sig Start Date End Date Taking? Authorizing Provider  acetaminophen (TYLENOL) 325 MG tablet Take 2 tablets (650 mg total) by mouth every 6 (six) hours as needed for moderate pain or fever. 05/26/14  Yes Fayrene Helper, PA-C  allopurinol (ZYLOPRIM) 100 MG tablet Take 100 mg by mouth daily.   Yes Historical Provider, MD  alum & mag hydroxide-simeth (MAALOX/MYLANTA) 200-200-20 MG/5ML suspension Take 30 mLs by mouth every 6 (six) hours as needed for indigestion or heartburn.   Yes Historical Provider, MD  aspirin 81 MG chewable tablet Chew 81 mg by mouth daily.   Yes Historical Provider, MD  atorvastatin (LIPITOR) 40 MG tablet Take 40 mg by mouth daily.   Yes Historical Provider, MD  guaifenesin (ROBITUSSIN) 100 MG/5ML syrup Take 200 mg by mouth every 6 (six) hours as needed for cough.   Yes Historical Provider, MD  loperamide (IMODIUM) 2 MG capsule Take 2-4 mg by mouth every 4 (four) hours as needed for diarrhea or loose stools (do not exceed 8 doses in 24 hours).   Yes Historical Provider, MD  LORazepam (ATIVAN) 0.5 MG tablet Take 0.5 mg by mouth 2 (two) times daily as needed for anxiety.  Yes Historical Provider, MD  magnesium hydroxide (MILK OF MAGNESIA) 400 MG/5ML suspension Take 30 mLs by mouth at bedtime as needed for mild constipation or moderate constipation.   Yes Historical Provider, MD  neomycin-bacitracin-polymyxin (NEOSPORIN) OINT Apply 1 application topically daily as needed for wound care.   Yes Historical Provider, MD  trimethoprim-polymyxin b (POLYTRIM) ophthalmic solution Place 1 drop into both eyes every 4 (four) hours. 07/13/15 07/20/15 Yes Arby BarretteMarcy  Pfeiffer, MD   BP 139/95 mmHg  Pulse 97  Temp(Src) 97.6 F (36.4 C) (Oral)  Resp 18  SpO2 99% Physical Exam  Constitutional: She is oriented to person, place, and time. She appears well-developed and well-nourished. No distress.  HENT:  Head: Normocephalic and atraumatic.  Eyes: Conjunctivae and EOM are normal.  Neck: Normal range of motion.  Cardiovascular: Normal rate, regular rhythm, normal heart sounds and intact distal pulses.  Exam reveals no gallop and no friction rub.   No murmur heard. Pulmonary/Chest: Effort normal and breath sounds normal. No respiratory distress. She has no wheezes. She has no rales.  Abdominal: Soft. She exhibits no distension. There is no tenderness. There is no guarding.  Musculoskeletal: She exhibits no edema or tenderness.  Neurological: She is alert and oriented to person, place, and time. She has normal strength. No cranial nerve deficit or sensory deficit. Coordination normal. GCS eye subscore is 4. GCS verbal subscore is 5. GCS motor subscore is 6.  Skin: Skin is warm and dry. No rash noted. She is not diaphoretic. No erythema.  Nursing note and vitals reviewed.   ED Course  Procedures (including critical care time) Labs Review Labs Reviewed  I-STAT CHEM 8, ED - Abnormal; Notable for the following:    Potassium 3.3 (*)    Chloride 99 (*)    BUN 45 (*)    Creatinine, Ser 1.50 (*)    Calcium, Ion 1.06 (*)    All other components within normal limits    Imaging Review No results found. I have personally reviewed and evaluated these images and lab results as part of my medical decision-making.   EKG Interpretation None      MDM   Final diagnoses:  Dementia, without behavioral disturbance  Unresponsive episode  Watery eyes    70 year old female with a history of hypertension and dementia presents with concern for possible episode of unresponsiveness. Per EMS, they had been called out for unresponsiveness, however on their arrival  the facility had reported watery eyes.  I called the facility and spoke with the nurse, had received report that the patient had an episode of unresponsiveness and was transferred to the emergency department.  It was not clear how long this episode lasted, or the quality of the episode per notes or nursing hand-off.  Patient's pseudo-daughter/POA reports she will sometimes ignore people if she doesn't want to wake up.  Patient has normal vital signs, is currently at her baseline. The facility denies any falls, and patient has no signs of trauma on exam. She has normal strength and sensation in all 4 extremities, normal CN exam and have low suspicion for CVA by history and physical exam.  EKG shows no acute findings. Pt without signs of arrhythmia on telemetry.  She was recently evaluated in the ED 2 days ago and had normal urinalysis and CXR and have low suspicion for infection by history, recent evaluation and exam.  Obtained IStat Chem8 which shows mildly elevated Cr from previous, likely mild dehydration. Recommended rehydration and follow up with  PCP as outpatient.  Patient discharged in stable condition with understanding of reasons to return.   Alvira Monday, MD 07/15/15 2005

## 2015-07-18 LAB — CULTURE, BLOOD (ROUTINE X 2)
CULTURE: NO GROWTH
Culture: NO GROWTH

## 2015-09-13 ENCOUNTER — Emergency Department (HOSPITAL_COMMUNITY): Payer: Medicare Other

## 2015-09-13 ENCOUNTER — Encounter (HOSPITAL_COMMUNITY): Payer: Self-pay | Admitting: Emergency Medicine

## 2015-09-13 ENCOUNTER — Inpatient Hospital Stay (HOSPITAL_COMMUNITY)
Admission: EM | Admit: 2015-09-13 | Discharge: 2015-09-18 | DRG: 088 | Disposition: A | Discharge status: Skilled Nursing Facility | Payer: Medicare Other | Attending: Internal Medicine | Admitting: Internal Medicine

## 2015-09-13 DIAGNOSIS — E785 Hyperlipidemia, unspecified: Secondary | ICD-10-CM | POA: Diagnosis not present

## 2015-09-13 DIAGNOSIS — M109 Gout, unspecified: Secondary | ICD-10-CM | POA: Diagnosis present

## 2015-09-13 DIAGNOSIS — F0390 Unspecified dementia without behavioral disturbance: Secondary | ICD-10-CM

## 2015-09-13 DIAGNOSIS — I10 Essential (primary) hypertension: Secondary | ICD-10-CM | POA: Diagnosis present

## 2015-09-13 DIAGNOSIS — S060X1A Concussion with loss of consciousness of 30 minutes or less, initial encounter: Secondary | ICD-10-CM | POA: Diagnosis not present

## 2015-09-13 DIAGNOSIS — S8391XA Sprain of unspecified site of right knee, initial encounter: Secondary | ICD-10-CM | POA: Diagnosis present

## 2015-09-13 DIAGNOSIS — E162 Hypoglycemia, unspecified: Secondary | ICD-10-CM

## 2015-09-13 DIAGNOSIS — E43 Unspecified severe protein-calorie malnutrition: Secondary | ICD-10-CM | POA: Diagnosis present

## 2015-09-13 DIAGNOSIS — S0010XA Contusion of unspecified eyelid and periocular area, initial encounter: Secondary | ICD-10-CM | POA: Diagnosis present

## 2015-09-13 DIAGNOSIS — S0081XA Abrasion of other part of head, initial encounter: Secondary | ICD-10-CM

## 2015-09-13 DIAGNOSIS — Z6835 Body mass index (BMI) 35.0-35.9, adult: Secondary | ICD-10-CM

## 2015-09-13 DIAGNOSIS — Z7982 Long term (current) use of aspirin: Secondary | ICD-10-CM

## 2015-09-13 DIAGNOSIS — R55 Syncope and collapse: Secondary | ICD-10-CM | POA: Diagnosis not present

## 2015-09-13 DIAGNOSIS — N390 Urinary tract infection, site not specified: Secondary | ICD-10-CM

## 2015-09-13 DIAGNOSIS — Z66 Do not resuscitate: Secondary | ICD-10-CM | POA: Diagnosis present

## 2015-09-13 DIAGNOSIS — I951 Orthostatic hypotension: Secondary | ICD-10-CM | POA: Diagnosis present

## 2015-09-13 DIAGNOSIS — S8392XA Sprain of unspecified site of left knee, initial encounter: Secondary | ICD-10-CM | POA: Diagnosis present

## 2015-09-13 DIAGNOSIS — W19XXXA Unspecified fall, initial encounter: Secondary | ICD-10-CM | POA: Diagnosis not present

## 2015-09-13 DIAGNOSIS — E161 Other hypoglycemia: Secondary | ICD-10-CM | POA: Diagnosis present

## 2015-09-13 HISTORY — DX: Unspecified dementia, unspecified severity, without behavioral disturbance, psychotic disturbance, mood disturbance, and anxiety: F03.90

## 2015-09-13 HISTORY — DX: Gout, unspecified: M10.9

## 2015-09-13 LAB — CBC
HCT: 39.3 % (ref 36.0–46.0)
HEMOGLOBIN: 13 g/dL (ref 12.0–15.0)
MCH: 31.9 pg (ref 26.0–34.0)
MCHC: 33.1 g/dL (ref 30.0–36.0)
MCV: 96.6 fL (ref 78.0–100.0)
Platelets: 197 10*3/uL (ref 150–400)
RBC: 4.07 MIL/uL (ref 3.87–5.11)
RDW: 14.7 % (ref 11.5–15.5)
WBC: 3.3 10*3/uL — ABNORMAL LOW (ref 4.0–10.5)

## 2015-09-13 LAB — URINALYSIS, ROUTINE W REFLEX MICROSCOPIC
Bilirubin Urine: NEGATIVE
GLUCOSE, UA: 250 mg/dL — AB
HGB URINE DIPSTICK: NEGATIVE
KETONES UR: NEGATIVE mg/dL
Nitrite: NEGATIVE
PROTEIN: NEGATIVE mg/dL
Specific Gravity, Urine: 1.013 (ref 1.005–1.030)
pH: 7 (ref 5.0–8.0)

## 2015-09-13 LAB — BASIC METABOLIC PANEL
ANION GAP: 11 (ref 5–15)
BUN: 16 mg/dL (ref 6–20)
CALCIUM: 9.8 mg/dL (ref 8.9–10.3)
CO2: 23 mmol/L (ref 22–32)
Chloride: 105 mmol/L (ref 101–111)
Creatinine, Ser: 0.97 mg/dL (ref 0.44–1.00)
GFR calc Af Amer: >60 mL/min (ref >60)
GFR, EST NON AFRICAN AMERICAN: 57 mL/min — AB (ref >60)
GLUCOSE: 66 mg/dL (ref 65–99)
Potassium: 4.7 mmol/L (ref 3.5–5.1)
SODIUM: 139 mmol/L (ref 135–145)

## 2015-09-13 LAB — CBC WITH DIFFERENTIAL/PLATELET
Basophils Absolute: 0 10*3/uL (ref 0.0–0.1)
Basophils Relative: 0 %
Eosinophils Absolute: 0.1 10*3/uL (ref 0.0–0.7)
Eosinophils Relative: 1 %
HEMATOCRIT: 46 % (ref 36.0–46.0)
HEMOGLOBIN: 14.8 g/dL (ref 12.0–15.0)
LYMPHS PCT: 29 %
Lymphs Abs: 1.3 10*3/uL (ref 0.7–4.0)
MCH: 31.2 pg (ref 26.0–34.0)
MCHC: 32.2 g/dL (ref 30.0–36.0)
MCV: 97 fL (ref 78.0–100.0)
MONO ABS: 0.6 10*3/uL (ref 0.1–1.0)
MONOS PCT: 13 %
NEUTROS ABS: 2.6 10*3/uL (ref 1.7–7.7)
Neutrophils Relative %: 57 %
Platelets: 185 10*3/uL (ref 150–400)
RBC: 4.74 MIL/uL (ref 3.87–5.11)
RDW: 14.6 % (ref 11.5–15.5)
WBC: 4.6 10*3/uL (ref 4.0–10.5)

## 2015-09-13 LAB — GLUCOSE, CAPILLARY: Glucose-Capillary: 180 mg/dL — ABNORMAL HIGH (ref 65–99)

## 2015-09-13 LAB — URINE MICROSCOPIC-ADD ON

## 2015-09-13 LAB — CBG MONITORING, ED
GLUCOSE-CAPILLARY: 175 mg/dL — AB (ref 65–99)
GLUCOSE-CAPILLARY: 79 mg/dL (ref 65–99)
Glucose-Capillary: 43 mg/dL — CL (ref 65–99)
Glucose-Capillary: 135 mg/dL — ABNORMAL HIGH (ref 65–99)
Glucose-Capillary: 86 mg/dL (ref 65–99)

## 2015-09-13 MED ORDER — ATORVASTATIN CALCIUM 40 MG PO TABS
40.0000 mg | ORAL_TABLET | Freq: Every day | ORAL | Status: DC
  Administered 2015-09-14 – 2015-09-18 (×5): 40 mg via ORAL
  Filled 2015-09-13 (×5): qty 1

## 2015-09-13 MED ORDER — DEXTROSE 5 % IV SOLN
1.0000 g | INTRAVENOUS | Status: DC
  Administered 2015-09-13 – 2015-09-17 (×5): 1 g via INTRAVENOUS
  Filled 2015-09-13 (×6): qty 10

## 2015-09-13 MED ORDER — DEXTROSE-NACL 5-0.9 % IV SOLN
INTRAVENOUS | Status: AC
  Administered 2015-09-13 – 2015-09-14 (×2): via INTRAVENOUS

## 2015-09-13 MED ORDER — LORAZEPAM 0.5 MG PO TABS: 0.5000 mg | ORAL_TABLET | Freq: Two times a day (BID) | ORAL | Status: DC | PRN

## 2015-09-13 MED ORDER — DEXTROSE 50 % IV SOLN
50.0000 mL | INTRAVENOUS | Status: DC | PRN
  Administered 2015-09-13: 50 mL via INTRAVENOUS
  Filled 2015-09-13: qty 50

## 2015-09-13 MED ORDER — ENOXAPARIN SODIUM 40 MG/0.4ML ~~LOC~~ SOLN
40.0000 mg | SUBCUTANEOUS | Status: DC
Start: 2015-09-13 — End: 2015-09-18
  Administered 2015-09-13 – 2015-09-17 (×5): 40 mg via SUBCUTANEOUS
  Filled 2015-09-13 (×5): qty 0.4

## 2015-09-13 MED ORDER — DEXTROSE 50 % IV SOLN
50.0000 mL | Freq: Once | INTRAVENOUS | Status: AC
  Administered 2015-09-13: 50 mL via INTRAVENOUS
  Filled 2015-09-13: qty 50

## 2015-09-13 MED ORDER — ALLOPURINOL 100 MG PO TABS
100.0000 mg | ORAL_TABLET | Freq: Every day | ORAL | Status: DC
  Administered 2015-09-14 – 2015-09-18 (×5): 100 mg via ORAL
  Filled 2015-09-13 (×5): qty 1

## 2015-09-13 MED ORDER — ACETAMINOPHEN 650 MG RE SUPP: 650.0000 mg | Freq: Four times a day (QID) | RECTAL | Status: DC | PRN

## 2015-09-13 MED ORDER — ONDANSETRON HCL 4 MG/2ML IJ SOLN: 4.0000 mg | Freq: Four times a day (QID) | INTRAMUSCULAR | Status: DC | PRN

## 2015-09-13 MED ORDER — ACETAMINOPHEN 325 MG PO TABS
650.0000 mg | ORAL_TABLET | Freq: Four times a day (QID) | ORAL | Status: DC | PRN
  Administered 2015-09-13 – 2015-09-17 (×2): 650 mg via ORAL
  Filled 2015-09-13 (×2): qty 2

## 2015-09-13 MED ORDER — ASPIRIN 81 MG PO CHEW
81.0000 mg | CHEWABLE_TABLET | Freq: Every day | ORAL | Status: DC
  Administered 2015-09-14 – 2015-09-18 (×5): 81 mg via ORAL
  Filled 2015-09-13 (×5): qty 1

## 2015-09-13 MED ORDER — SODIUM CHLORIDE 0.9% FLUSH
3.0000 mL | Freq: Two times a day (BID) | INTRAVENOUS | Status: DC
Start: 2015-09-13 — End: 2015-09-18
  Administered 2015-09-13 – 2015-09-18 (×8): 3 mL via INTRAVENOUS

## 2015-09-13 MED ORDER — ONDANSETRON HCL 4 MG PO TABS: 4.0000 mg | ORAL_TABLET | Freq: Four times a day (QID) | ORAL | Status: DC | PRN

## 2015-09-13 NOTE — ED Provider Notes (Signed)
CSN: [id number]     Arrival date & time 09/13/15  1243 History   First MD Initiated Contact with Patient 09/13/15 1248     Chief Complaint  Patient presents with  . Head Injury   LEVEL 5 CAVEAT DUE TO DEMENTIA  Patient is a 70 y.o. female presenting with head injury.  Head Injury Location:  Frontal Time since incident: UNKNOWN. Mechanism of injury: fall   Pain details:    Quality:  Aching   Timing:  Constant   Progression:  Unchanged Chronicity:  New Relieved by:  Nothing Worsened by:  Nothing tried Associated symptoms: disorientation   Patient with h/o Hypertension, dementia who presents after fall at nursing facility Per friend/caregiver, she was called that patient had not come out of her room at facility [facility]) and they went to check on her and she was sitting in a chair with abrasion/hematoma to forehead.  No other details are known  Pt with h/o dementia, but friend reports she is usually much more awake/alert and seems more confused than normal   Past Medical History  Diagnosis Date  . Hypertension   . Dementia   . Gout    No past surgical history on file. No family history on file. Social History  Substance Use Topics  . Smoking status: Never Smoker   . Smokeless tobacco: Not on file  . Alcohol Use: No   OB History    No data available     Review of Systems  Unable to perform ROS: Dementia    Allergies  Review of patient's allergies indicates no known allergies.  Home Medications   Prior to Admission medications   Medication Sig Start Date End Date Taking? Authorizing Provider  acetaminophen (TYLENOL) 325 MG tablet Take 2 tablets (650 mg total) by mouth every 6 (six) hours as needed for moderate pain or fever. 05/26/14   Fayrene Helper, PA-C  allopurinol (ZYLOPRIM) 100 MG tablet Take 100 mg by mouth daily.    Historical Provider, MD  alum & mag hydroxide-simeth (MAALOX/MYLANTA) 200-200-20 MG/5ML suspension Take 30 mLs by mouth every 6 (six) hours  as needed for indigestion or heartburn.    Historical Provider, MD  aspirin 81 MG chewable tablet Chew 81 mg by mouth daily.    Historical Provider, MD  atorvastatin (LIPITOR) 40 MG tablet Take 40 mg by mouth daily.    Historical Provider, MD  guaifenesin (ROBITUSSIN) 100 MG/5ML syrup Take 200 mg by mouth every 6 (six) hours as needed for cough.    Historical Provider, MD  loperamide (IMODIUM) 2 MG capsule Take 2-4 mg by mouth every 4 (four) hours as needed for diarrhea or loose stools (do not exceed 8 doses in 24 hours).    Historical Provider, MD  LORazepam (ATIVAN) 0.5 MG tablet Take 0.5 mg by mouth 2 (two) times daily as needed for anxiety.    Historical Provider, MD  magnesium hydroxide (MILK OF MAGNESIA) 400 MG/5ML suspension Take 30 mLs by mouth at bedtime as needed for mild constipation or moderate constipation.    Historical Provider, MD  neomycin-bacitracin-polymyxin (NEOSPORIN) OINT Apply 1 application topically daily as needed for wound care.    Historical Provider, MD   BP 180/107 mmHg  Pulse 74  Temp(Src) 97.9 F (36.6 C) (Oral)  Resp 10  SpO2 98% Physical Exam CONSTITUTIONAL: elderly, frail HEAD: abrasion/hematoma to forehead.  Large left periorbital edema noted.  No obvious eye injury.   EYES: PERRL, pupils pinpoint ENMT: Mucous membranes moist SPINE/BACK:entire  spine nontender CV: S1/S2 noted LUNGS: Lungs are clear to auscultation bilaterally ABDOMEN: soft, nontender NEURO: Pt is somnolent but arousable.  Maex4.    EXTREMITIES: pulses normal/equal, full ROM, tenderness to ROM of both knees, no deformity to obvious deformity to legs and no tenderness to ROM of either hip SKIN: warm, color normal PSYCH: unable to assess  ED Course  Procedures  2:32 PM I attempted to call facility ([facility], [phone]) but unable to reach any nursing staff Here she has hypoglycemia Will give dose of D50 Imaging pending at this time 3:29 PM Imaging thus far  negative Pt more alert  Labs pending I spoke to her friend Burna Mortimer) and updated on plan via phone Plan at signout to dr Clayborne Dana, check glucose q 1hour until more awake/alert and taking PO fluids  Labs Review Labs Reviewed  CBG MONITORING, ED - Abnormal; Notable for the following:    Glucose-Capillary 43 (*)    All other components within normal limits  BASIC METABOLIC PANEL  CBC WITH DIFFERENTIAL/PLATELET  URINALYSIS, ROUTINE W REFLEX MICROSCOPIC (NOT AT [facility])  CBG MONITORING, ED    Imaging Review Ct Head Wo Contrast  09/13/2015  CLINICAL DATA:  70 year old female with unwitnessed fall. Large hematoma about the left eye. Unknown loss of consciousness. Initial encounter. EXAM: CT HEAD WITHOUT CONTRAST CT ORBITS WITHOUT CONTRAST CT CERVICAL SPINE WITHOUT CONTRAST TECHNIQUE: Multidetector CT imaging of the head, cervical spine, and orbital structures were performed using the standard protocol without intravenous contrast. Multiplanar CT image reconstructions of the cervical spine and maxillofacial structures were also generated. COMPARISON:  HEAD CT WITHOUT CONTRAST 12/07/2014. FINDINGS: CT HEAD FINDINGS Visualized paranasal sinuses and mastoids are clear. Large, 1.5 cm thick broad-based left frontal convexity scalp hematoma is present with a small volume of subcutaneous gas. Underlying frontal bones are intact. Left frontal sinus is clear. See also orbit findings below. Other scalp soft tissues are within normal limits. No calvarium fracture. Stable cerebral volume. No ventriculomegaly. No midline shift, mass effect, or evidence of intracranial mass lesion. No acute intracranial hemorrhage identified. Patchy and confluent white matter and deep gray matter hypodensity appears stable. No cortically based acute infarct identified. Dystrophic basal ganglia calcifications. No suspicious intracranial vascular hyperdensity. CT ORBITS FINDINGS Large broad-based left frontal region scalp hematoma tracks over  the left orbit. The left globe remains intact. No intraorbital hematoma or contusion identified. Left orbital walls remain intact. Left zygoma intact. No nasal bone fracture. Visible left maxilla intact. Paranasal sinuses are clear aside from trace bilateral mucosal thickening. Negative right orbit soft tissues, sequelae of bilateral cataract surgery incidentally noted. Negative visualized noncontrast deep soft tissue spaces of the face. Bilateral EAC debris. There is mild opacification of some inferior mastoid air cells. CT CERVICAL SPINE FINDINGS Visualized skull base is intact. No atlanto-occipital dissociation. Degenerative ligamentous hypertrophy about the odontoid. C1-C2 alignment within normal limits, in those levels appear intact. Cervicothoracic junction alignment is within normal limits. Bilateral posterior element alignment is within normal limits. Moderate to severe chronic disc and endplate degeneration from C2-C3 to C6-C7. Multifactorial at least moderate degenerative spinal stenosis at C3-C4 (series 402, image 40). No cervical spine fracture identified. Grossly intact visualized upper thoracic levels. Negative lung apices. Calcified carotid bifurcation atherosclerosis. Otherwise negative noncontrast paraspinal soft tissues. IMPRESSION: 1. Left scalp soft tissue injury with large superficial scalp and periorbital hematoma. No intraorbital injury. No underlying fracture. 2. No acute traumatic injury to the brain. 3. No acute fracture or listhesis identified in  the cervical spine. Ligamentous injury is not excluded. 4. Advanced cervical spine degeneration. At least moderate multifactorial degenerative spinal stenosis suspected at C3-C4. Electronically Signed   By: Odessa Fleming M.D.   On: 09/13/2015 15:10   Ct Cervical Spine Wo Contrast  09/13/2015  CLINICAL DATA:  70 year old female with unwitnessed fall. Large hematoma about the left eye. Unknown loss of consciousness. Initial encounter. EXAM: CT HEAD  WITHOUT CONTRAST CT ORBITS WITHOUT CONTRAST CT CERVICAL SPINE WITHOUT CONTRAST TECHNIQUE: Multidetector CT imaging of the head, cervical spine, and orbital structures were performed using the standard protocol without intravenous contrast. Multiplanar CT image reconstructions of the cervical spine and maxillofacial structures were also generated. COMPARISON:  HEAD CT WITHOUT CONTRAST 12/07/2014. FINDINGS: CT HEAD FINDINGS Visualized paranasal sinuses and mastoids are clear. Large, 1.5 cm thick broad-based left frontal convexity scalp hematoma is present with a small volume of subcutaneous gas. Underlying frontal bones are intact. Left frontal sinus is clear. See also orbit findings below. Other scalp soft tissues are within normal limits. No calvarium fracture. Stable cerebral volume. No ventriculomegaly. No midline shift, mass effect, or evidence of intracranial mass lesion. No acute intracranial hemorrhage identified. Patchy and confluent white matter and deep gray matter hypodensity appears stable. No cortically based acute infarct identified. Dystrophic basal ganglia calcifications. No suspicious intracranial vascular hyperdensity. CT ORBITS FINDINGS Large broad-based left frontal region scalp hematoma tracks over the left orbit. The left globe remains intact. No intraorbital hematoma or contusion identified. Left orbital walls remain intact. Left zygoma intact. No nasal bone fracture. Visible left maxilla intact. Paranasal sinuses are clear aside from trace bilateral mucosal thickening. Negative right orbit soft tissues, sequelae of bilateral cataract surgery incidentally noted. Negative visualized noncontrast deep soft tissue spaces of the face. Bilateral EAC debris. There is mild opacification of some inferior mastoid air cells. CT CERVICAL SPINE FINDINGS Visualized skull base is intact. No atlanto-occipital dissociation. Degenerative ligamentous hypertrophy about the odontoid. C1-C2 alignment within normal  limits, in those levels appear intact. Cervicothoracic junction alignment is within normal limits. Bilateral posterior element alignment is within normal limits. Moderate to severe chronic disc and endplate degeneration from C2-C3 to C6-C7. Multifactorial at least moderate degenerative spinal stenosis at C3-C4 (series 402, image 40). No cervical spine fracture identified. Grossly intact visualized upper thoracic levels. Negative lung apices. Calcified carotid bifurcation atherosclerosis. Otherwise negative noncontrast paraspinal soft tissues. IMPRESSION: 1. Left scalp soft tissue injury with large superficial scalp and periorbital hematoma. No intraorbital injury. No underlying fracture. 2. No acute traumatic injury to the brain. 3. No acute fracture or listhesis identified in the cervical spine. Ligamentous injury is not excluded. 4. Advanced cervical spine degeneration. At least moderate multifactorial degenerative spinal stenosis suspected at C3-C4. Electronically Signed   By: Odessa Fleming M.D.   On: 09/13/2015 15:10   Dg Knee Complete 4 Views Left  09/13/2015  CLINICAL DATA:  Injury today, fell down, BILATERAL knee pain, week, history hypertension, dementia EXAM: RIGHT KNEE - COMPLETE 4+ VIEW; LEFT KNEE - COMPLETE 4+ VIEW COMPARISON:  None FINDINGS: Osseous demineralization. Minimal joint space narrowing. No acute fracture, dislocation or bone destruction. Scattered atherosclerotic calcifications. No knee joint effusion. IMPRESSION: Minimal degenerative changes without acute bony abnormalities. Electronically Signed   By: Ulyses Southward M.D.   On: 09/13/2015 14:21   Dg Knee Complete 4 Views Right  09/13/2015  CLINICAL DATA:  Injury today, fell down, BILATERAL knee pain, week, history hypertension, dementia EXAM: RIGHT KNEE - COMPLETE 4+ VIEW; LEFT KNEE -  COMPLETE 4+ VIEW COMPARISON:  None FINDINGS: Osseous demineralization. Minimal joint space narrowing. No acute fracture, dislocation or bone destruction.  Scattered atherosclerotic calcifications. No knee joint effusion. IMPRESSION: Minimal degenerative changes without acute bony abnormalities. Electronically Signed   By: Ulyses SouthwardMark  Boles M.D.   On: 09/13/2015 14:21   Ct Orbitss W/o Cm  09/13/2015  CLINICAL DATA:  70 year old female with unwitnessed fall. Large hematoma about the left eye. Unknown loss of consciousness. Initial encounter. EXAM: CT HEAD WITHOUT CONTRAST CT ORBITS WITHOUT CONTRAST CT CERVICAL SPINE WITHOUT CONTRAST TECHNIQUE: Multidetector CT imaging of the head, cervical spine, and orbital structures were performed using the standard protocol without intravenous contrast. Multiplanar CT image reconstructions of the cervical spine and maxillofacial structures were also generated. COMPARISON:  HEAD CT WITHOUT CONTRAST 12/07/2014. FINDINGS: CT HEAD FINDINGS Visualized paranasal sinuses and mastoids are clear. Large, 1.5 cm thick broad-based left frontal convexity scalp hematoma is present with a small volume of subcutaneous gas. Underlying frontal bones are intact. Left frontal sinus is clear. See also orbit findings below. Other scalp soft tissues are within normal limits. No calvarium fracture. Stable cerebral volume. No ventriculomegaly. No midline shift, mass effect, or evidence of intracranial mass lesion. No acute intracranial hemorrhage identified. Patchy and confluent white matter and deep gray matter hypodensity appears stable. No cortically based acute infarct identified. Dystrophic basal ganglia calcifications. No suspicious intracranial vascular hyperdensity. CT ORBITS FINDINGS Large broad-based left frontal region scalp hematoma tracks over the left orbit. The left globe remains intact. No intraorbital hematoma or contusion identified. Left orbital walls remain intact. Left zygoma intact. No nasal bone fracture. Visible left maxilla intact. Paranasal sinuses are clear aside from trace bilateral mucosal thickening. Negative right orbit soft  tissues, sequelae of bilateral cataract surgery incidentally noted. Negative visualized noncontrast deep soft tissue spaces of the face. Bilateral EAC debris. There is mild opacification of some inferior mastoid air cells. CT CERVICAL SPINE FINDINGS Visualized skull base is intact. No atlanto-occipital dissociation. Degenerative ligamentous hypertrophy about the odontoid. C1-C2 alignment within normal limits, in those levels appear intact. Cervicothoracic junction alignment is within normal limits. Bilateral posterior element alignment is within normal limits. Moderate to severe chronic disc and endplate degeneration from C2-C3 to C6-C7. Multifactorial at least moderate degenerative spinal stenosis at C3-C4 (series 402, image 40). No cervical spine fracture identified. Grossly intact visualized upper thoracic levels. Negative lung apices. Calcified carotid bifurcation atherosclerosis. Otherwise negative noncontrast paraspinal soft tissues. IMPRESSION: 1. Left scalp soft tissue injury with large superficial scalp and periorbital hematoma. No intraorbital injury. No underlying fracture. 2. No acute traumatic injury to the brain. 3. No acute fracture or listhesis identified in the cervical spine. Ligamentous injury is not excluded. 4. Advanced cervical spine degeneration. At least moderate multifactorial degenerative spinal stenosis suspected at C3-C4. Electronically Signed   By: Odessa FlemingH  Hall M.D.   On: 09/13/2015 15:10   I have personally reviewed and evaluated these images and lab results as part of my medical decision-making.   EKG Interpretation   Date/Time:  Wednesday Sep 13 2015 14:25:26 EDT Ventricular Rate:  76 PR Interval:  136 QRS Duration: 73 QT Interval:  374 QTC Calculation: 420 R Axis:   86 Text Interpretation:  Sinus rhythm Borderline right axis deviation Minimal  ST elevation, inferior leads No significant change since last tracing  Confirmed by Bebe ShaggyWICKLINE  MD, Nargis Abrams ([id number]) on 09/13/2015  2:29:35 PM     Medications  dextrose 50 % solution 50 mL (50 mLs Intravenous Given 09/13/15 1435)  MDM   Final diagnoses:  Hypoglycemia  Concussion, with loss of consciousness of 30 minutes or less, initial encounter  Forehead abrasion, initial encounter  Sprain of right knee, initial encounter  Sprain of left knee, initial encounter    Nursing notes including past medical history and social history reviewed and considered in documentation Labs/vital reviewed myself and considered during evaluation xrays/imaging reviewed by myself and considered during evaluation     Zadie Rhine, MD 09/13/15 1530

## 2015-09-13 NOTE — H&P (Signed)
HISTORY AND PHYSICAL       PATIENT DETAILS Name: Caroline Butler Age: 70 y.o. Sex: female Date of Birth: [birth year] Admit Date: 09/13/2015 ZOX:[id number], MD Referring MD/NP/PA: Patient coming from: Memory care unit   CHIEF COMPLAINT:  Fall with  left periorbital hematoma  HPI: Caroline Butler is a 70 y.o. female with medical history significant of Dementia, dyslipidemia who presented to the ED for evaluation of fall and left periorbital hematoma.please note, patient has dementia and currently is very uncooperative and unreliable historian.There is no family at bedside, and most of this history is obtained after reviewing chart.Apparently, patient was found earlier today sitting on a chair in room at a memory care unit with a Abrasion and hematoma on her left eye.She was thought to be more confused than usual. It was not known whether she sustained a mechanical fall or actually had a syncopal episode.She was subsequently brought to the ED for further evaluation and treatment.At this time unable to obtain any further history.  ED Course:  In the emergency room, patient was found to be hypoglycemic,CT spine of her head, C-spine and orbits  did not reveal any significant acute pathology except for left superficial scalp and periorbital hematoma.Due to concern for syncope,Hypoglycemia, UTI, was asked to admit this patient for further evaluation and treatment  Lives at: Memory care unit  REVIEW OF SYSTEMS:  Unable to obtain given mental status  ALLERGIES:  No Known Allergies  PAST MEDICAL HISTORY: Past Medical History  Diagnosis Date  . Hypertension   . Dementia   . Gout     PAST SURGICAL HISTORY: No past surgical history on file.  MEDICATIONS AT HOME: Prior to Admission medications   Medication Sig Start Date End Date Taking? Authorizing Provider  acetaminophen (TYLENOL) 325 MG tablet Take 2 tablets (650 mg total) by mouth every 6 (six) hours as needed  for moderate pain or fever. 05/26/14  Yes Fayrene Helper, PA-C  allopurinol (ZYLOPRIM) 100 MG tablet Take 100 mg by mouth daily.   Yes Historical Provider, MD  alum & mag hydroxide-simeth (MAALOX/MYLANTA) 200-200-20 MG/5ML suspension Take 30 mLs by mouth every 6 (six) hours as needed for indigestion or heartburn.   Yes Historical Provider, MD  aspirin 81 MG chewable tablet Chew 81 mg by mouth daily.   Yes Historical Provider, MD  atorvastatin (LIPITOR) 40 MG tablet Take 40 mg by mouth daily.   Yes Historical Provider, MD  guaifenesin (ROBITUSSIN) 100 MG/5ML syrup Take 200 mg by mouth every 6 (six) hours as needed for cough.   Yes Historical Provider, MD  loperamide (IMODIUM) 2 MG capsule Take 2-4 mg by mouth every 4 (four) hours as needed for diarrhea or loose stools (do not exceed 8 doses in 24 hours).   Yes Historical Provider, MD  LORazepam (ATIVAN) 0.5 MG tablet Take 0.5 mg by mouth 2 (two) times daily as needed for anxiety.   Yes Historical Provider, MD  magnesium hydroxide (MILK OF MAGNESIA) 400 MG/5ML suspension Take 30 mLs by mouth at bedtime as needed for mild constipation or moderate constipation.   Yes Historical Provider, MD  neomycin-bacitracin-polymyxin (NEOSPORIN) OINT Apply 1 application topically daily as needed for wound care.   Yes Historical Provider, MD    FAMILY HISTORY: Unable to obtain due to mental status.  SOCIAL HISTORY:  reports that she has never smoked. She does not have any smokeless tobacco history on file. She reports that she does not drink alcohol. Her drug  history is not on file.  PHYSICAL EXAM: Blood pressure 113/75, pulse 73, temperature 97.9 F (36.6 C), temperature source Oral, resp. rate 12, SpO2 97 %.  General appearance :Sleeping comfortably-easily awakes with minimal painful stimuli-speech clear-she appears to follow only some commands occasionally.Seems to move all 4 extremities. HEENT: Small abrasion to the left forehead area along with a left  periorbital hematoma. Neck: supple, no JVD. No cervical lymphadenopathy.  Chest:Good air entry bilaterally, no added sounds  CVS: S1 S2 regular Abdomen: Bowel sounds present, Non tender and not distended with no gaurding, rigidity or rebound. Extremities: B/L Lower Ext shows no edema, both legs are warm to touch Neurology:  Difficult exam-but moving all 4 extremities Psychiatric:Has dementia-unable to assess Skin:No Rash Wounds:N/A  LABS ON ADMISSION:  I have personally reviewed following labs and imaging studies  CBC:  Recent Labs Lab 09/13/15 1627  WBC 4.6  NEUTROABS 2.6  HGB 14.8  HCT 46.0  MCV 97.0  PLT 185    Basic Metabolic Panel:  Recent Labs Lab 09/13/15 1430  NA 139  K 4.7  CL 105  CO2 23  GLUCOSE 66  BUN 16  CREATININE 0.97  CALCIUM 9.8    GFR: CrCl cannot be calculated (Unknown ideal weight.).  Liver Function Tests: No results for input(s): AST, ALT, ALKPHOS, BILITOT, PROT, ALBUMIN in the last 168 hours. No results for input(s): LIPASE, AMYLASE in the last 168 hours. No results for input(s): AMMONIA in the last 168 hours.  Coagulation Profile: No results for input(s): INR, PROTIME in the last 168 hours.  Cardiac Enzymes: No results for input(s): CKTOTAL, CKMB, CKMBINDEX, TROPONINI in the last 168 hours.  BNP (last 3 results) No results for input(s): PROBNP in the last 8760 hours.  HbA1C: No results for input(s): HGBA1C in the last 72 hours.  CBG:  Recent Labs Lab 09/13/15 1422 09/13/15 1515 09/13/15 1657 09/13/15 1808  GLUCAP 43* 175* 135* 86    Lipid Profile: No results for input(s): CHOL, HDL, LDLCALC, TRIG, CHOLHDL, LDLDIRECT in the last 72 hours.  Thyroid Function Tests: No results for input(s): TSH, T4TOTAL, FREET4, T3FREE, THYROIDAB in the last 72 hours.  Anemia Panel: No results for input(s): VITAMINB12, FOLATE, FERRITIN, TIBC, IRON, RETICCTPCT in the last 72 hours.  Urine analysis:    Component Value Date/Time    COLORURINE YELLOW 09/13/2015 1519   APPEARANCEUR CLEAR 09/13/2015 1519   LABSPEC 1.013 09/13/2015 1519   PHURINE 7.0 09/13/2015 1519   GLUCOSEU 250* 09/13/2015 1519   HGBUR NEGATIVE 09/13/2015 1519   BILIRUBINUR NEGATIVE 09/13/2015 1519   KETONESUR NEGATIVE 09/13/2015 1519   PROTEINUR NEGATIVE 09/13/2015 1519   UROBILINOGEN 0.2 12/07/2014 1828   NITRITE NEGATIVE 09/13/2015 1519   LEUKOCYTESUR LARGE* 09/13/2015 1519    Sepsis Labs: Lactic Acid, Venous    Component Value Date/Time   LATICACIDVEN 1.53 07/13/2015 1653     Microbiology: No results found for this or any previous visit (from the past 240 hour(s)).    RADIOLOGIC STUDIES ON ADMISSION: Ct Head Wo Contrast  09/13/2015  CLINICAL DATA:  70 year old female with unwitnessed fall. Large hematoma about the left eye. Unknown loss of consciousness. Initial encounter. EXAM: CT HEAD WITHOUT CONTRAST CT ORBITS WITHOUT CONTRAST CT CERVICAL SPINE WITHOUT CONTRAST TECHNIQUE: Multidetector CT imaging of the head, cervical spine, and orbital structures were performed using the standard protocol without intravenous contrast. Multiplanar CT image reconstructions of the cervical spine and maxillofacial structures were also generated. COMPARISON:  HEAD CT WITHOUT CONTRAST 12/07/2014. FINDINGS:  CT HEAD FINDINGS Visualized paranasal sinuses and mastoids are clear. Large, 1.5 cm thick broad-based left frontal convexity scalp hematoma is present with a small volume of subcutaneous gas. Underlying frontal bones are intact. Left frontal sinus is clear. See also orbit findings below. Other scalp soft tissues are within normal limits. No calvarium fracture. Stable cerebral volume. No ventriculomegaly. No midline shift, mass effect, or evidence of intracranial mass lesion. No acute intracranial hemorrhage identified. Patchy and confluent white matter and deep gray matter hypodensity appears stable. No cortically based acute infarct identified. Dystrophic basal  ganglia calcifications. No suspicious intracranial vascular hyperdensity. CT ORBITS FINDINGS Large broad-based left frontal region scalp hematoma tracks over the left orbit. The left globe remains intact. No intraorbital hematoma or contusion identified. Left orbital walls remain intact. Left zygoma intact. No nasal bone fracture. Visible left maxilla intact. Paranasal sinuses are clear aside from trace bilateral mucosal thickening. Negative right orbit soft tissues, sequelae of bilateral cataract surgery incidentally noted. Negative visualized noncontrast deep soft tissue spaces of the face. Bilateral EAC debris. There is mild opacification of some inferior mastoid air cells. CT CERVICAL SPINE FINDINGS Visualized skull base is intact. No atlanto-occipital dissociation. Degenerative ligamentous hypertrophy about the odontoid. C1-C2 alignment within normal limits, in those levels appear intact. Cervicothoracic junction alignment is within normal limits. Bilateral posterior element alignment is within normal limits. Moderate to severe chronic disc and endplate degeneration from C2-C3 to C6-C7. Multifactorial at least moderate degenerative spinal stenosis at C3-C4 (series 402, image 40). No cervical spine fracture identified. Grossly intact visualized upper thoracic levels. Negative lung apices. Calcified carotid bifurcation atherosclerosis. Otherwise negative noncontrast paraspinal soft tissues. IMPRESSION: 1. Left scalp soft tissue injury with large superficial scalp and periorbital hematoma. No intraorbital injury. No underlying fracture. 2. No acute traumatic injury to the brain. 3. No acute fracture or listhesis identified in the cervical spine. Ligamentous injury is not excluded. 4. Advanced cervical spine degeneration. At least moderate multifactorial degenerative spinal stenosis suspected at C3-C4. Electronically Signed   By: Odessa FlemingH  Hall M.D.   On: 09/13/2015 15:10   Ct Cervical Spine Wo Contrast  09/13/2015   CLINICAL DATA:  70 year old female with unwitnessed fall. Large hematoma about the left eye. Unknown loss of consciousness. Initial encounter. EXAM: CT HEAD WITHOUT CONTRAST CT ORBITS WITHOUT CONTRAST CT CERVICAL SPINE WITHOUT CONTRAST TECHNIQUE: Multidetector CT imaging of the head, cervical spine, and orbital structures were performed using the standard protocol without intravenous contrast. Multiplanar CT image reconstructions of the cervical spine and maxillofacial structures were also generated. COMPARISON:  HEAD CT WITHOUT CONTRAST 12/07/2014. FINDINGS: CT HEAD FINDINGS Visualized paranasal sinuses and mastoids are clear. Large, 1.5 cm thick broad-based left frontal convexity scalp hematoma is present with a small volume of subcutaneous gas. Underlying frontal bones are intact. Left frontal sinus is clear. See also orbit findings below. Other scalp soft tissues are within normal limits. No calvarium fracture. Stable cerebral volume. No ventriculomegaly. No midline shift, mass effect, or evidence of intracranial mass lesion. No acute intracranial hemorrhage identified. Patchy and confluent white matter and deep gray matter hypodensity appears stable. No cortically based acute infarct identified. Dystrophic basal ganglia calcifications. No suspicious intracranial vascular hyperdensity. CT ORBITS FINDINGS Large broad-based left frontal region scalp hematoma tracks over the left orbit. The left globe remains intact. No intraorbital hematoma or contusion identified. Left orbital walls remain intact. Left zygoma intact. No nasal bone fracture. Visible left maxilla intact. Paranasal sinuses are clear aside from trace bilateral mucosal thickening. Negative  right orbit soft tissues, sequelae of bilateral cataract surgery incidentally noted. Negative visualized noncontrast deep soft tissue spaces of the face. Bilateral EAC debris. There is mild opacification of some inferior mastoid air cells. CT CERVICAL SPINE FINDINGS  Visualized skull base is intact. No atlanto-occipital dissociation. Degenerative ligamentous hypertrophy about the odontoid. C1-C2 alignment within normal limits, in those levels appear intact. Cervicothoracic junction alignment is within normal limits. Bilateral posterior element alignment is within normal limits. Moderate to severe chronic disc and endplate degeneration from C2-C3 to C6-C7. Multifactorial at least moderate degenerative spinal stenosis at C3-C4 (series 402, image 40). No cervical spine fracture identified. Grossly intact visualized upper thoracic levels. Negative lung apices. Calcified carotid bifurcation atherosclerosis. Otherwise negative noncontrast paraspinal soft tissues. IMPRESSION: 1. Left scalp soft tissue injury with large superficial scalp and periorbital hematoma. No intraorbital injury. No underlying fracture. 2. No acute traumatic injury to the brain. 3. No acute fracture or listhesis identified in the cervical spine. Ligamentous injury is not excluded. 4. Advanced cervical spine degeneration. At least moderate multifactorial degenerative spinal stenosis suspected at C3-C4. Electronically Signed   By: Odessa Fleming M.D.   On: 09/13/2015 15:10   Dg Knee Complete 4 Views Left  09/13/2015  CLINICAL DATA:  Injury today, fell down, BILATERAL knee pain, week, history hypertension, dementia EXAM: RIGHT KNEE - COMPLETE 4+ VIEW; LEFT KNEE - COMPLETE 4+ VIEW COMPARISON:  None FINDINGS: Osseous demineralization. Minimal joint space narrowing. No acute fracture, dislocation or bone destruction. Scattered atherosclerotic calcifications. No knee joint effusion. IMPRESSION: Minimal degenerative changes without acute bony abnormalities. Electronically Signed   By: Ulyses Southward M.D.   On: 09/13/2015 14:21   Dg Knee Complete 4 Views Right  09/13/2015  CLINICAL DATA:  Injury today, fell down, BILATERAL knee pain, week, history hypertension, dementia EXAM: RIGHT KNEE - COMPLETE 4+ VIEW; LEFT KNEE -  COMPLETE 4+ VIEW COMPARISON:  None FINDINGS: Osseous demineralization. Minimal joint space narrowing. No acute fracture, dislocation or bone destruction. Scattered atherosclerotic calcifications. No knee joint effusion. IMPRESSION: Minimal degenerative changes without acute bony abnormalities. Electronically Signed   By: Ulyses Southward M.D.   On: 09/13/2015 14:21   Ct Orbitss W/o Cm  09/13/2015  CLINICAL DATA:  70 year old female with unwitnessed fall. Large hematoma about the left eye. Unknown loss of consciousness. Initial encounter. EXAM: CT HEAD WITHOUT CONTRAST CT ORBITS WITHOUT CONTRAST CT CERVICAL SPINE WITHOUT CONTRAST TECHNIQUE: Multidetector CT imaging of the head, cervical spine, and orbital structures were performed using the standard protocol without intravenous contrast. Multiplanar CT image reconstructions of the cervical spine and maxillofacial structures were also generated. COMPARISON:  HEAD CT WITHOUT CONTRAST 12/07/2014. FINDINGS: CT HEAD FINDINGS Visualized paranasal sinuses and mastoids are clear. Large, 1.5 cm thick broad-based left frontal convexity scalp hematoma is present with a small volume of subcutaneous gas. Underlying frontal bones are intact. Left frontal sinus is clear. See also orbit findings below. Other scalp soft tissues are within normal limits. No calvarium fracture. Stable cerebral volume. No ventriculomegaly. No midline shift, mass effect, or evidence of intracranial mass lesion. No acute intracranial hemorrhage identified. Patchy and confluent white matter and deep gray matter hypodensity appears stable. No cortically based acute infarct identified. Dystrophic basal ganglia calcifications. No suspicious intracranial vascular hyperdensity. CT ORBITS FINDINGS Large broad-based left frontal region scalp hematoma tracks over the left orbit. The left globe remains intact. No intraorbital hematoma or contusion identified. Left orbital walls remain intact. Left zygoma intact. No  nasal bone fracture. Visible left maxilla  intact. Paranasal sinuses are clear aside from trace bilateral mucosal thickening. Negative right orbit soft tissues, sequelae of bilateral cataract surgery incidentally noted. Negative visualized noncontrast deep soft tissue spaces of the face. Bilateral EAC debris. There is mild opacification of some inferior mastoid air cells. CT CERVICAL SPINE FINDINGS Visualized skull base is intact. No atlanto-occipital dissociation. Degenerative ligamentous hypertrophy about the odontoid. C1-C2 alignment within normal limits, in those levels appear intact. Cervicothoracic junction alignment is within normal limits. Bilateral posterior element alignment is within normal limits. Moderate to severe chronic disc and endplate degeneration from C2-C3 to C6-C7. Multifactorial at least moderate degenerative spinal stenosis at C3-C4 (series 402, image 40). No cervical spine fracture identified. Grossly intact visualized upper thoracic levels. Negative lung apices. Calcified carotid bifurcation atherosclerosis. Otherwise negative noncontrast paraspinal soft tissues. IMPRESSION: 1. Left scalp soft tissue injury with large superficial scalp and periorbital hematoma. No intraorbital injury. No underlying fracture. 2. No acute traumatic injury to the brain. 3. No acute fracture or listhesis identified in the cervical spine. Ligamentous injury is not excluded. 4. Advanced cervical spine degeneration. At least moderate multifactorial degenerative spinal stenosis suspected at C3-C4. Electronically Signed   By: Odessa Fleming M.D.   On: 09/13/2015 15:10    I have personally reviewed images of CT head/C-spine and orbits  EKG:  Personally reviewed. Normal sinus rhythm-nonspecific ST changes.  ASSESSMENT AND PLAN: Present on Admission:  . Fall:Not sure whether this was a mechanical fall or syncopal episode. Monitor in telemetry, check orthostatics,check echocardiogram. Doubt if above studies negative  further workup required at this time.Will obtain PT/OT evaluation.  Marland Kitchen UTI (lower urinary tract infection); Start Rocephin, await cultures.  Given mental status, very hard to discern whether she has any symptoms of UTI.  Marland Kitchen Dyslipidemia:Continue statin  . Dementia: Pleasantly confused-but at times gets Agitated And uncooperative.Continue Ativan as needed.. Supportive care.  Further plan will depend as patient's clinical course evolves and further radiologic and laboratory data become available. Patient will be monitored closely.  No family members present at bedside. This M.D. tried calling Girtha Hake [phone] message.   CONSULTS: None  DVT Prophylaxis: Prophylactic Lovenox   Code Status: Full Code-No documentation of CODE STATUS in many People present at bedside. No family or friends at bedside  Disposition Plan:  Discharge back to Memory care unit vs SNF possibly in 1-2 days, depending on clinical course  Admission status: obs going to tele  Total time spent 35 minutes.Greater than 50% of this time was spent in counseling, explanation of diagnosis, planning of further management, and coordination of care.  [facility] Pager [phone]  If 7PM-7AM, please contact night-coverage [email or web address] Password [email or web address] 09/13/2015, 6:42 PM

## 2015-09-13 NOTE — ED Provider Notes (Signed)
3:41 PM Assumed care from Dr. Bebe ShaggyWickline, please see their note for full history, physical and decision making until this point. In brief this is a 70 y.o. year old female who presented to the ED tonight with Head Injury     Here for ams, likely 2/2 hypoglycemia. Watching blood sugars.   On reevaluation, patient still decreased responsiveness. Will grimace to pain but wont conversate per normal per family so will admit for o/n obs.   Labs, studies and imaging reviewed by myself and considered in medical decision making if ordered. Imaging interpreted by radiology.  Labs Reviewed  BASIC METABOLIC PANEL - Abnormal; Notable for the following:    GFR calc non Af Amer 57 (*)    All other components within normal limits  CBG MONITORING, ED - Abnormal; Notable for the following:    Glucose-Capillary 43 (*)    All other components within normal limits  CBC WITH DIFFERENTIAL/PLATELET  URINALYSIS, ROUTINE W REFLEX MICROSCOPIC (NOT [facility])  CBG MONITORING, ED  CBG MONITORING, ED  CBG MONITORING, ED    CT Cervical Spine Wo Contrast  Final Result    CT OrbitsS W/O CM  Final Result    CT Head Wo Contrast  Final Result    DG Knee Complete 4 Views Right  Final Result    DG Knee Complete 4 Views Left  Final Result      No Follow-up on file.   Marily MemosJason Adalai Perl, MD 09/13/15 [phone]

## 2015-09-13 NOTE — ED Notes (Signed)
Pt's CBG, 43. Nurse was notified.

## 2015-09-13 NOTE — ED Notes (Signed)
Report attempted 

## 2015-09-13 NOTE — ED Notes (Addendum)
[facility] unwitnessed fall, unknown LOC. Large hematoma above left eye. Awake, responds to painful stimuli, pt nonverbal at present. Not answering questions. Pupils pinpoint. VSS.cbg 74. 18g LFA

## 2015-09-14 ENCOUNTER — Observation Stay (HOSPITAL_COMMUNITY): Payer: Medicare Other

## 2015-09-14 ENCOUNTER — Encounter (HOSPITAL_COMMUNITY): Payer: Self-pay | Admitting: *Deleted

## 2015-09-14 DIAGNOSIS — S8392XA Sprain of unspecified site of left knee, initial encounter: Secondary | ICD-10-CM | POA: Diagnosis present

## 2015-09-14 DIAGNOSIS — Z7982 Long term (current) use of aspirin: Secondary | ICD-10-CM | POA: Diagnosis not present

## 2015-09-14 DIAGNOSIS — M109 Gout, unspecified: Secondary | ICD-10-CM | POA: Diagnosis present

## 2015-09-14 DIAGNOSIS — S060X1A Concussion with loss of consciousness of 30 minutes or less, initial encounter: Secondary | ICD-10-CM | POA: Diagnosis present

## 2015-09-14 DIAGNOSIS — S8391XA Sprain of unspecified site of right knee, initial encounter: Secondary | ICD-10-CM | POA: Diagnosis present

## 2015-09-14 DIAGNOSIS — N390 Urinary tract infection, site not specified: Secondary | ICD-10-CM | POA: Diagnosis not present

## 2015-09-14 DIAGNOSIS — R55 Syncope and collapse: Secondary | ICD-10-CM | POA: Diagnosis not present

## 2015-09-14 DIAGNOSIS — F0390 Unspecified dementia without behavioral disturbance: Secondary | ICD-10-CM | POA: Diagnosis present

## 2015-09-14 DIAGNOSIS — E785 Hyperlipidemia, unspecified: Secondary | ICD-10-CM | POA: Diagnosis not present

## 2015-09-14 DIAGNOSIS — S0010XA Contusion of unspecified eyelid and periocular area, initial encounter: Secondary | ICD-10-CM | POA: Diagnosis present

## 2015-09-14 DIAGNOSIS — Z6835 Body mass index (BMI) 35.0-35.9, adult: Secondary | ICD-10-CM | POA: Diagnosis not present

## 2015-09-14 DIAGNOSIS — E43 Unspecified severe protein-calorie malnutrition: Secondary | ICD-10-CM | POA: Diagnosis present

## 2015-09-14 DIAGNOSIS — W19XXXA Unspecified fall, initial encounter: Secondary | ICD-10-CM | POA: Diagnosis not present

## 2015-09-14 DIAGNOSIS — Z66 Do not resuscitate: Secondary | ICD-10-CM | POA: Diagnosis present

## 2015-09-14 DIAGNOSIS — I951 Orthostatic hypotension: Secondary | ICD-10-CM | POA: Diagnosis present

## 2015-09-14 DIAGNOSIS — I10 Essential (primary) hypertension: Secondary | ICD-10-CM | POA: Diagnosis present

## 2015-09-14 DIAGNOSIS — E161 Other hypoglycemia: Secondary | ICD-10-CM | POA: Diagnosis present

## 2015-09-14 LAB — BASIC METABOLIC PANEL
ANION GAP: 9 (ref 5–15)
BUN: 13 mg/dL (ref 6–20)
CALCIUM: 9.3 mg/dL (ref 8.9–10.3)
CHLORIDE: 103 mmol/L (ref 101–111)
CO2: 29 mmol/L (ref 22–32)
CREATININE: 1.11 mg/dL — AB (ref 0.44–1.00)
GFR calc non Af Amer: 49 mL/min — ABNORMAL LOW (ref >60)
GFR, EST AFRICAN AMERICAN: 57 mL/min — AB (ref >60)
Glucose, Bld: 78 mg/dL (ref 65–99)
Potassium: 3.6 mmol/L (ref 3.5–5.1)
SODIUM: 141 mmol/L (ref 135–145)

## 2015-09-14 LAB — GLUCOSE, CAPILLARY
GLUCOSE-CAPILLARY: 121 mg/dL — AB (ref 65–99)
Glucose-Capillary: 69 mg/dL (ref 65–99)
Glucose-Capillary: 108 mg/dL — ABNORMAL HIGH (ref 65–99)
Glucose-Capillary: 58 mg/dL — ABNORMAL LOW (ref 65–99)
Glucose-Capillary: 104 mg/dL — ABNORMAL HIGH (ref 65–99)
Glucose-Capillary: 72 mg/dL (ref 65–99)

## 2015-09-14 LAB — ECHOCARDIOGRAM COMPLETE: WEIGHTICAEL: 2941.82 [oz_av]

## 2015-09-14 LAB — TROPONIN I

## 2015-09-14 LAB — CBC
HCT: 36.8 % (ref 36.0–46.0)
HEMOGLOBIN: 12 g/dL (ref 12.0–15.0)
MCH: 31.3 pg (ref 26.0–34.0)
MCHC: 32.6 g/dL (ref 30.0–36.0)
MCV: 95.8 fL (ref 78.0–100.0)
Platelets: 173 10*3/uL (ref 150–400)
RBC: 3.84 MIL/uL — ABNORMAL LOW (ref 3.87–5.11)
RDW: 14.8 % (ref 11.5–15.5)
WBC: 3 10*3/uL — AB (ref 4.0–10.5)

## 2015-09-14 LAB — CREATININE, SERUM
Creatinine, Ser: 1.06 mg/dL — ABNORMAL HIGH (ref 0.44–1.00)
GFR, EST AFRICAN AMERICAN: 60 mL/min — AB (ref >60)
GFR, EST NON AFRICAN AMERICAN: 52 mL/min — AB (ref >60)

## 2015-09-14 LAB — MRSA PCR SCREENING: MRSA BY PCR: NEGATIVE

## 2015-09-14 MED ORDER — ENSURE ENLIVE PO LIQD
237.0000 mL | Freq: Two times a day (BID) | ORAL | Status: DC
  Administered 2015-09-14 – 2015-09-18 (×8): 237 mL via ORAL

## 2015-09-14 NOTE — Clinical Social Work Placement (Signed)
   CLINICAL SOCIAL WORK PLACEMENT  NOTE  Date:  09/14/2015  Patient Details  Name: Caroline Butler MRN: [id number] Date of Birth: 04-22-1946  Clinical Social Work is seeking post-discharge placement for this patient at the Skilled  Nursing Facility level of care (*CSW will initial, date and re-position this form in  chart as items are completed):  Yes   Patient/family provided with [facility] Clinical Social Work Department's list of facilities offering this level of care within the geographic area requested by the patient (or if unable, by the patient's family).  Yes   Patient/family informed of their freedom to choose among providers that offer the needed level of care, that participate in Medicare, Medicaid or managed care program needed by the patient, have an available bed and are willing to accept the patient.  Yes   Patient/family informed of [facility]'s ownership interest [facility] and [facility], as well as of the fact that they are under no obligation to receive care at these facilities.  PASRR submitted to EDS on       PASRR number received on       Existing PASRR number confirmed on       FL2 transmitted to all facilities in geographic area requested by pt/family on       FL2 transmitted to all facilities within larger geographic area on       Patient informed that his/her managed care company has contracts with or will negotiate with certain facilities, including the following:            Patient/family informed of bed offers received.  Patient chooses bed at       Physician recommends and patient chooses bed at      Patient to be transferred to   on  .  Patient to be transferred to facility by       Patient family notified on   of transfer.  Name of family member notified:        PHYSICIAN       Additional Comment:    _______________________________________________ Margarito LinerSarah C Ashish Rossetti, LCSW 09/14/2015, 3:02 PM

## 2015-09-14 NOTE — Progress Notes (Signed)
  Echocardiogram 2D Echocardiogram has been performed.  Janalyn HarderWest, Kodi Steil R 09/14/2015, 1:08 PM

## 2015-09-14 NOTE — Progress Notes (Signed)
Initial Nutrition Assessment  DOCUMENTATION CODES:   Severe malnutrition in context of chronic illness  INTERVENTION:  -Ensure Enlive po BID, each supplement provides 350 kcal and 20 grams of protein -RD continue to monitor  NUTRITION DIAGNOSIS:   Malnutrition related to chronic illness as evidenced by severe depletion of body fat, severe depletion of muscle mass.  GOAL:   Patient will meet greater than or equal to 90% of their needs  MONITOR:   PO intake, Labs, I & O's, Weight trends  REASON FOR ASSESSMENT:   Consult Assessment of nutrition requirement/status  ASSESSMENT:   Caroline Butler is a 70 y.o. female with medical history significant of Dementia, dyslipidemia who presented to the ED for evaluation of fall and left periorbital hematoma.please note, patient has dementia and currently is very uncooperative and unreliable historian  Attempted to speak with Caroline Butler at bedside but she was pleasantly confused.  Nutrition-Focused physical exam completed. Findings are severe fat depletion, severe muscle depletion, and no edema.   Information is retrieved from chart. She presented with left periorbital hematoma, currently has severe swelling.  Unable to retrieve pt's diet hx but if patient's presentation is baseline mentation it is likely poor. Patient appears severely malnourished despite weight of 83.4kg. PO intake in chart 50-100% past two meals. Given GOC and mentation, pt is not a candidate for nutrition support. Continue to follow for PO intake, mentation, provide ensure, monitor for acceptance.  Labs and Medications reviewed.  Diet Order:  Diet Heart Room service appropriate?: Yes; Fluid consistency:: Thin  Skin:  Wound (see comment) (Hematoma to L side of Face)  Last BM:  5/10  Height:   Ht Readings from Last 1 Encounters:  No data found for Ht    Weight:   Wt Readings from Last 1 Encounters:  09/14/15 183 lb 13.8 oz (83.4 kg)    Ideal Body  Weight:     BMI:  There is no height on file to calculate BMI.  Estimated Nutritional Needs:   Kcal:  1300-1650 calories  Protein:  80-90 grams  Fluid:  >/= 1.3L  EDUCATION NEEDS:   No education needs identified at this time  Dionne AnoWilliam M. Anastazia Creek, MS, RD LDN After Hours/Weekend Pager [phone]

## 2015-09-14 NOTE — Progress Notes (Addendum)
PROGRESS NOTE        PATIENT DETAILS Name: Caroline Butler Age: 70 y.o. Sex: female Date of Birth: 04-04-1946 Admit Date: 09/13/2015 Admitting Physician Dewayne Shorter Levora Dredge, MD ZOX:[id number], MD Outpatient Specialists:None  Brief Narrative: Patient is a 70 y.o. female  with medical history significant of Dementia, dyslipidemia who presented to the ED for evaluation of fall and left periorbital hematoma.  Subjective: Appears pleasantly confused-but is awake-only follows a few commands. Does not recollect falling yesterday.  Assessment/Plan: Principal Problem: Fall: Uncertain whether this was a mechanical fall or a syncopal episode. Orthostatic vital signs positive yesterday, given IV fluids, recheck orthostatics today. Telemetry negative, await echocardiogram and PT evaluation. Note, CT head and CT cervical spine negative.  Active Problems: UTI: Afebrile, no leukocytosis. Continue Rocephin, await cultures.  Hypoglycemia: Had one occurrence of hypoglycemia in the emergency room on admission, none since then. A1c pending. Continue supportive care. Doubt further workup required for just one isolated event.  Left eye periorbital hematoma: Likely secondary to fall. Continue with Supportive care, CT of the orbits negative for fracture.  Dyslipidemia: Continue statin  History of gout: Continue allopurinol  Dementia with mild delirium: Supportive care. No family members at bedside to get further collaborative history.  Addendum 2:45 PM Spoke with Octavia Bruckner [phone] dated her on patient's condition. She did clarify patient's CODE STATUS-patient is a DO NOT RESUSCITATE. She is aware that patient may need SNF on discharge.   DVT Prophylaxis: Prophylactic Lovenox   Code Status: Full code   Family Communication: None at bedside-left a message for Girtha Hake again today awaiting callback  Disposition Plan: Remain inpatient-back to  SNF/ALF 5/12  Antimicrobial agents: IV Rocephin 5/10>>  Procedures: None  CONSULTS:  None  Time spent: 25 minutes-Greater than 50% of this time was spent in counseling, explanation of diagnosis, planning of further management, and coordination of care.  MEDICATIONS: Anti-infectives    Start     Dose/Rate Route Frequency Ordered Stop   09/13/15 2300  cefTRIAXone (ROCEPHIN) 1 g in dextrose 5 % 50 mL IVPB    Comments:  Please given ONLY after urine culture has been collected   1 g 100 mL/hr over 30 Minutes Intravenous Every 24 hours 09/13/15 2236        Scheduled Meds: . allopurinol  100 mg Oral Daily  . aspirin  81 mg Oral Daily  . atorvastatin  40 mg Oral Daily  . cefTRIAXone (ROCEPHIN)  IV  1 g Intravenous Q24H  . enoxaparin (LOVENOX) injection  40 mg Subcutaneous Q24H  . sodium chloride flush  3 mL Intravenous Q12H   Continuous Infusions: . dextrose 5 % and 0.9% NaCl 50 mL/hr at 09/13/15 2352   PRN Meds:.acetaminophen **OR** acetaminophen, dextrose, LORazepam, ondansetron **OR** ondansetron (ZOFRAN) IV   PHYSICAL EXAM: Vital signs: Filed Vitals:   09/14/15 0455 09/14/15 0617 09/14/15 1000 09/14/15 1239  BP:  136/81 103/54 163/91  Pulse:  94 95 92  Temp:   98.7 F (37.1 C) 98.1 F (36.7 C)  TempSrc:  Oral Oral Oral  Resp:   16 16  Weight: 83.4 kg (183 lb 13.8 oz)     SpO2:  100% 99% 100%   Filed Weights   09/13/15 2056 09/14/15 0455  Weight: 83 kg (182 lb 15.7 oz) 83.4 kg (183 lb 13.8 oz)   There is no height on  file to calculate BMI.   Gen Exam: AwakeAnd pleasantly confused. Sitting in chair at bedside. Noted any distress. Left eye periorbital hematoma appears stable.  Neck: Supple, No JVD.  Chest: B/L Clear.   CVS: S1 S2 Regular, no murmurs.  Abdomen: soft, BS +, non tender, non distended.  Extremities: no edema, lower extremities warm to touch. Neurologic: Non Focal.   Skin: No Rash or lesions   Wounds: N/A.    LABORATORY DATA: CBC:  Recent  Labs Lab 09/13/15 1627 09/13/15 2319 09/14/15 0459  WBC 4.6 3.3* 3.0*  NEUTROABS 2.6  --   --   HGB 14.8 13.0 12.0  HCT 46.0 39.3 36.8  MCV 97.0 96.6 95.8  PLT 185 197 173    Basic Metabolic Panel:  Recent Labs Lab 09/13/15 1430 09/13/15 2319 09/14/15 0459  NA 139  --  141  K 4.7  --  3.6  CL 105  --  103  CO2 23  --  29  GLUCOSE 66  --  78  BUN 16  --  13  CREATININE 0.97 1.06* 1.11*  CALCIUM 9.8  --  9.3    GFR: CrCl cannot be calculated (Unknown ideal weight.).  Liver Function Tests: No results for input(s): AST, ALT, ALKPHOS, BILITOT, PROT, ALBUMIN in the last 168 hours. No results for input(s): LIPASE, AMYLASE in the last 168 hours. No results for input(s): AMMONIA in the last 168 hours.  Coagulation Profile: No results for input(s): INR, PROTIME in the last 168 hours.  Cardiac Enzymes:  Recent Labs Lab 09/13/15 2319 09/14/15 0459  TROPONINI <0.03 <0.03    BNP (last 3 results) No results for input(s): PROBNP in the last 8760 hours.  HbA1C: No results for input(s): HGBA1C in the last 72 hours.  CBG:  Recent Labs Lab 09/13/15 1908 09/13/15 2051 09/14/15 0652 09/14/15 0745 09/14/15 1226  GLUCAP 79 180* 69 108* 58*    Lipid Profile: No results for input(s): CHOL, HDL, LDLCALC, TRIG, CHOLHDL, LDLDIRECT in the last 72 hours.  Thyroid Function Tests: No results for input(s): TSH, T4TOTAL, FREET4, T3FREE, THYROIDAB in the last 72 hours.  Anemia Panel: No results for input(s): VITAMINB12, FOLATE, FERRITIN, TIBC, IRON, RETICCTPCT in the last 72 hours.  Urine analysis:    Component Value Date/Time   COLORURINE YELLOW 09/13/2015 1519   APPEARANCEUR CLEAR 09/13/2015 1519   LABSPEC 1.013 09/13/2015 1519   PHURINE 7.0 09/13/2015 1519   GLUCOSEU 250* 09/13/2015 1519   HGBUR NEGATIVE 09/13/2015 1519   BILIRUBINUR NEGATIVE 09/13/2015 1519   KETONESUR NEGATIVE 09/13/2015 1519   PROTEINUR NEGATIVE 09/13/2015 1519   UROBILINOGEN 0.2 12/07/2014  1828   NITRITE NEGATIVE 09/13/2015 1519   LEUKOCYTESUR LARGE* 09/13/2015 1519    Sepsis Labs: Lactic Acid, Venous    Component Value Date/Time   LATICACIDVEN 1.53 07/13/2015 1653    MICROBIOLOGY: Recent Results (from the past 240 hour(s))  MRSA PCR Screening     Status: None   Collection Time: 09/14/15  1:51 AM  Result Value Ref Range Status   MRSA by PCR NEGATIVE NEGATIVE Final    Comment:        The GeneXpert MRSA Assay (FDA approved for NASAL specimens only), is one component of a comprehensive MRSA colonization surveillance program. It is not intended to diagnose MRSA infection nor to guide or monitor treatment for MRSA infections.     RADIOLOGY STUDIES/RESULTS: Ct Head Wo Contrast  09/13/2015  CLINICAL DATA:  70 year old female with unwitnessed fall. Large hematoma about  the left eye. Unknown loss of consciousness. Initial encounter. EXAM: CT HEAD WITHOUT CONTRAST CT ORBITS WITHOUT CONTRAST CT CERVICAL SPINE WITHOUT CONTRAST TECHNIQUE: Multidetector CT imaging of the head, cervical spine, and orbital structures were performed using the standard protocol without intravenous contrast. Multiplanar CT image reconstructions of the cervical spine and maxillofacial structures were also generated. COMPARISON:  HEAD CT WITHOUT CONTRAST 12/07/2014. FINDINGS: CT HEAD FINDINGS Visualized paranasal sinuses and mastoids are clear. Large, 1.5 cm thick broad-based left frontal convexity scalp hematoma is present with a small volume of subcutaneous gas. Underlying frontal bones are intact. Left frontal sinus is clear. See also orbit findings below. Other scalp soft tissues are within normal limits. No calvarium fracture. Stable cerebral volume. No ventriculomegaly. No midline shift, mass effect, or evidence of intracranial mass lesion. No acute intracranial hemorrhage identified. Patchy and confluent white matter and deep gray matter hypodensity appears stable. No cortically based acute infarct  identified. Dystrophic basal ganglia calcifications. No suspicious intracranial vascular hyperdensity. CT ORBITS FINDINGS Large broad-based left frontal region scalp hematoma tracks over the left orbit. The left globe remains intact. No intraorbital hematoma or contusion identified. Left orbital walls remain intact. Left zygoma intact. No nasal bone fracture. Visible left maxilla intact. Paranasal sinuses are clear aside from trace bilateral mucosal thickening. Negative right orbit soft tissues, sequelae of bilateral cataract surgery incidentally noted. Negative visualized noncontrast deep soft tissue spaces of the face. Bilateral EAC debris. There is mild opacification of some inferior mastoid air cells. CT CERVICAL SPINE FINDINGS Visualized skull base is intact. No atlanto-occipital dissociation. Degenerative ligamentous hypertrophy about the odontoid. C1-C2 alignment within normal limits, in those levels appear intact. Cervicothoracic junction alignment is within normal limits. Bilateral posterior element alignment is within normal limits. Moderate to severe chronic disc and endplate degeneration from C2-C3 to C6-C7. Multifactorial at least moderate degenerative spinal stenosis at C3-C4 (series 402, image 40). No cervical spine fracture identified. Grossly intact visualized upper thoracic levels. Negative lung apices. Calcified carotid bifurcation atherosclerosis. Otherwise negative noncontrast paraspinal soft tissues. IMPRESSION: 1. Left scalp soft tissue injury with large superficial scalp and periorbital hematoma. No intraorbital injury. No underlying fracture. 2. No acute traumatic injury to the brain. 3. No acute fracture or listhesis identified in the cervical spine. Ligamentous injury is not excluded. 4. Advanced cervical spine degeneration. At least moderate multifactorial degenerative spinal stenosis suspected at C3-C4. Electronically Signed   By: Odessa Fleming M.D.   On: 09/13/2015 15:10   Ct Cervical Spine  Wo Contrast  09/13/2015  CLINICAL DATA:  70 year old female with unwitnessed fall. Large hematoma about the left eye. Unknown loss of consciousness. Initial encounter. EXAM: CT HEAD WITHOUT CONTRAST CT ORBITS WITHOUT CONTRAST CT CERVICAL SPINE WITHOUT CONTRAST TECHNIQUE: Multidetector CT imaging of the head, cervical spine, and orbital structures were performed using the standard protocol without intravenous contrast. Multiplanar CT image reconstructions of the cervical spine and maxillofacial structures were also generated. COMPARISON:  HEAD CT WITHOUT CONTRAST 12/07/2014. FINDINGS: CT HEAD FINDINGS Visualized paranasal sinuses and mastoids are clear. Large, 1.5 cm thick broad-based left frontal convexity scalp hematoma is present with a small volume of subcutaneous gas. Underlying frontal bones are intact. Left frontal sinus is clear. See also orbit findings below. Other scalp soft tissues are within normal limits. No calvarium fracture. Stable cerebral volume. No ventriculomegaly. No midline shift, mass effect, or evidence of intracranial mass lesion. No acute intracranial hemorrhage identified. Patchy and confluent white matter and deep gray matter hypodensity appears stable. No cortically  based acute infarct identified. Dystrophic basal ganglia calcifications. No suspicious intracranial vascular hyperdensity. CT ORBITS FINDINGS Large broad-based left frontal region scalp hematoma tracks over the left orbit. The left globe remains intact. No intraorbital hematoma or contusion identified. Left orbital walls remain intact. Left zygoma intact. No nasal bone fracture. Visible left maxilla intact. Paranasal sinuses are clear aside from trace bilateral mucosal thickening. Negative right orbit soft tissues, sequelae of bilateral cataract surgery incidentally noted. Negative visualized noncontrast deep soft tissue spaces of the face. Bilateral EAC debris. There is mild opacification of some inferior mastoid air cells.  CT CERVICAL SPINE FINDINGS Visualized skull base is intact. No atlanto-occipital dissociation. Degenerative ligamentous hypertrophy about the odontoid. C1-C2 alignment within normal limits, in those levels appear intact. Cervicothoracic junction alignment is within normal limits. Bilateral posterior element alignment is within normal limits. Moderate to severe chronic disc and endplate degeneration from C2-C3 to C6-C7. Multifactorial at least moderate degenerative spinal stenosis at C3-C4 (series 402, image 40). No cervical spine fracture identified. Grossly intact visualized upper thoracic levels. Negative lung apices. Calcified carotid bifurcation atherosclerosis. Otherwise negative noncontrast paraspinal soft tissues. IMPRESSION: 1. Left scalp soft tissue injury with large superficial scalp and periorbital hematoma. No intraorbital injury. No underlying fracture. 2. No acute traumatic injury to the brain. 3. No acute fracture or listhesis identified in the cervical spine. Ligamentous injury is not excluded. 4. Advanced cervical spine degeneration. At least moderate multifactorial degenerative spinal stenosis suspected at C3-C4. Electronically Signed   By: Odessa Fleming M.D.   On: 09/13/2015 15:10   Dg Knee Complete 4 Views Left  09/13/2015  CLINICAL DATA:  Injury today, fell down, BILATERAL knee pain, week, history hypertension, dementia EXAM: RIGHT KNEE - COMPLETE 4+ VIEW; LEFT KNEE - COMPLETE 4+ VIEW COMPARISON:  None FINDINGS: Osseous demineralization. Minimal joint space narrowing. No acute fracture, dislocation or bone destruction. Scattered atherosclerotic calcifications. No knee joint effusion. IMPRESSION: Minimal degenerative changes without acute bony abnormalities. Electronically Signed   By: Ulyses Southward M.D.   On: 09/13/2015 14:21   Dg Knee Complete 4 Views Right  09/13/2015  CLINICAL DATA:  Injury today, fell down, BILATERAL knee pain, week, history hypertension, dementia EXAM: RIGHT KNEE - COMPLETE  4+ VIEW; LEFT KNEE - COMPLETE 4+ VIEW COMPARISON:  None FINDINGS: Osseous demineralization. Minimal joint space narrowing. No acute fracture, dislocation or bone destruction. Scattered atherosclerotic calcifications. No knee joint effusion. IMPRESSION: Minimal degenerative changes without acute bony abnormalities. Electronically Signed   By: Ulyses Southward M.D.   On: 09/13/2015 14:21   Ct Orbitss W/o Cm  09/13/2015  CLINICAL DATA:  70 year old female with unwitnessed fall. Large hematoma about the left eye. Unknown loss of consciousness. Initial encounter. EXAM: CT HEAD WITHOUT CONTRAST CT ORBITS WITHOUT CONTRAST CT CERVICAL SPINE WITHOUT CONTRAST TECHNIQUE: Multidetector CT imaging of the head, cervical spine, and orbital structures were performed using the standard protocol without intravenous contrast. Multiplanar CT image reconstructions of the cervical spine and maxillofacial structures were also generated. COMPARISON:  HEAD CT WITHOUT CONTRAST 12/07/2014. FINDINGS: CT HEAD FINDINGS Visualized paranasal sinuses and mastoids are clear. Large, 1.5 cm thick broad-based left frontal convexity scalp hematoma is present with a small volume of subcutaneous gas. Underlying frontal bones are intact. Left frontal sinus is clear. See also orbit findings below. Other scalp soft tissues are within normal limits. No calvarium fracture. Stable cerebral volume. No ventriculomegaly. No midline shift, mass effect, or evidence of intracranial mass lesion. No acute intracranial hemorrhage identified. Patchy and confluent  white matter and deep gray matter hypodensity appears stable. No cortically based acute infarct identified. Dystrophic basal ganglia calcifications. No suspicious intracranial vascular hyperdensity. CT ORBITS FINDINGS Large broad-based left frontal region scalp hematoma tracks over the left orbit. The left globe remains intact. No intraorbital hematoma or contusion identified. Left orbital walls remain intact.  Left zygoma intact. No nasal bone fracture. Visible left maxilla intact. Paranasal sinuses are clear aside from trace bilateral mucosal thickening. Negative right orbit soft tissues, sequelae of bilateral cataract surgery incidentally noted. Negative visualized noncontrast deep soft tissue spaces of the face. Bilateral EAC debris. There is mild opacification of some inferior mastoid air cells. CT CERVICAL SPINE FINDINGS Visualized skull base is intact. No atlanto-occipital dissociation. Degenerative ligamentous hypertrophy about the odontoid. C1-C2 alignment within normal limits, in those levels appear intact. Cervicothoracic junction alignment is within normal limits. Bilateral posterior element alignment is within normal limits. Moderate to severe chronic disc and endplate degeneration from C2-C3 to C6-C7. Multifactorial at least moderate degenerative spinal stenosis at C3-C4 (series 402, image 40). No cervical spine fracture identified. Grossly intact visualized upper thoracic levels. Negative lung apices. Calcified carotid bifurcation atherosclerosis. Otherwise negative noncontrast paraspinal soft tissues. IMPRESSION: 1. Left scalp soft tissue injury with large superficial scalp and periorbital hematoma. No intraorbital injury. No underlying fracture. 2. No acute traumatic injury to the brain. 3. No acute fracture or listhesis identified in the cervical spine. Ligamentous injury is not excluded. 4. Advanced cervical spine degeneration. At least moderate multifactorial degenerative spinal stenosis suspected at C3-C4. Electronically Signed   By: Odessa Fleming M.D.   On: 09/13/2015 15:10       Jeoffrey Massed, MD  [facility] Pager:[phone]  If 7PM-7AM, please contact night-coverage [email or web address] Password [email or web address] 09/14/2015, 1:25 PM

## 2015-09-14 NOTE — Clinical Social Work Note (Signed)
Clinical Social Work Assessment  Patient Details  Name: Caroline Butler MRN: [id number] Date of Birth: June 01, 1945  Date of referral:  09/14/15               Reason for consult:  Facility Placement, Discharge Planning                Permission sought to share information with:  Facility Medical sales representativeContact Representative, Family Supports Permission granted to share information::  Yes, Verbal Permission Granted  Name::     Girtha HakeWanda Gaddy  [facility]  Relationship::  Family friend/POA  Contact Information:  [phone]  Housing/Transportation Living arrangements for the past 2 months:  [facility] Source of Information:  Medical Team, Power of Attorney Patient Interpreter Needed:  None Criminal Activity/Legal Involvement Pertinent to Current Situation/Hospitalization:  No - Comment as needed Significant Relationships:  Other(Comment) (Family friends/Caregivers/POA) Lives with:  Facility Resident Do you feel safe going back to the place where you live?  Yes Need for family participation in patient care:  Yes (Comment)  Care giving concerns:  PT recommending SNF at discharge.   Social Worker assessment / plan:  CSW called patient's friend/POA, Girtha HakeWanda Gaddy as patient is only oriented to self. CSW introduced role and explained that discharge planning would be discussed. Ms. Ladona RidgelGaddy is agreeable to patient going to a SNF instead of back to her ALF due to decrease in mobility. She will be able to transport at discharge. No further concerns. CSW encouraged patient's POA to contact CSW as needed. CSW will continue to follow patient and facilitate discharge once medically stable.  Employment status:  Retired Health and safety inspectornsurance information:  Medicare PT Recommendations:  Skilled Nursing Facility Information / Referral to community resources:  Skilled Nursing Facility  Patient/Family's Response to care:  Patient not fully oriented. Patient's friend/POA agreeable to SNF recommendation at  discharge. Patient's POA involved in patient's care and supportive. Patient's POA polite and appreciated social work intervention.  Patient/Family's Understanding of and Emotional Response to Diagnosis, Current Treatment, and Prognosis:  Patient's POA knowledgeable of medical interventions and aware of SNF placement recommendation at discharge.  Emotional Assessment Appearance:  Appears stated age Attitude/Demeanor/Rapport:  Unable to Assess Affect (typically observed):  Unable to Assess Orientation:  Oriented to Self Alcohol / Substance use:  Never Used Psych involvement (Current and /or in the community):  No (Comment)  Discharge Needs  Concerns to be addressed:  Care Coordination Readmission within the last 30 days:  No Current discharge risk:  Cognitively Impaired, Dependent with Mobility Barriers to Discharge:  No Barriers Identified   Margarito LinerSarah C Karlon Schlafer, LCSW 09/14/2015, 2:52 PM

## 2015-09-14 NOTE — Progress Notes (Signed)
Physical Therapy Evaluation Patient Details Name: Caroline Butler MRN: [id number] DOB: 06-16-1945 Today's Date: 09/14/2015   History of Present Illness  Caroline Butler is a 70 y.o. female with medical history significant of Dementia, dyslipidemia who presented to the ED for evaluation of fall and left periorbital hematoma.please note, patient has dementia and currently is very uncooperative and unreliable historian.There is no family at bedside, and most of this history is obtained after reviewing chart.Apparently, patient was found earlier today sitting on a chair in room at a memory care unit with a Abrasion and hematoma on her left eye.She was thought to be more confused than usual. It was not known whether she sustained a mechanical fall or actually had a syncopal episode.She was subsequently brought to the ED for further evaluation and treatment.At this time unable to obtain any further history.  Clinical Impression  Pt admitted with above diagnosis. Pt currently with functional limitations due to the deficits listed below (see PT Problem List). Pt was able to transfer to chair with min assist and cues.  Need to determine level of Assist provided my memory care unit.  If they provide min assist, pt can go back there but if not, pt will need SNF prior to dc back to memory care unit.  Will follow acutely.  Pt will benefit from skilled PT to increase their independence and safety with mobility to allow discharge to the venue listed below.      Follow Up Recommendations SNF;Supervision/Assistance - 24 hour    Equipment Recommendations  Other (comment) (TBA)    Recommendations for Other Services       Precautions / Restrictions Precautions Precautions: Fall Restrictions Weight Bearing Restrictions: No      Mobility  Bed Mobility Overal bed mobility: Needs Assistance Bed Mobility: Supine to Sit     Supine to sit: Min assist     General bed mobility comments: Pt needed assist  to initiate movement.  Assist to move LEs and elevate trunk.   Transfers Overall transfer level: Needs assistance Equipment used: Rolling walker (2 wheeled) Transfers: Sit to/from [employer] CorporationStand;Stand Pivot Transfers Sit to Stand: Min guard;Min assist Stand pivot transfers: Min guard;Min assist       General transfer comment: Pt needed assist to power up and steadying asssit to take pivotal steps to chair.   Ambulation/Gait                Stairs            Wheelchair Mobility    Modified Rankin (Stroke Patients Only)       Balance Overall balance assessment: Needs assistance;History of Falls Sitting-balance support: No upper extremity supported;Feet supported Sitting balance-Leahy Scale: Fair   Postural control: Posterior lean Standing balance support: Bilateral upper extremity supported;During functional activity Standing balance-Leahy Scale: Poor Standing balance comment: Leans slightly posteriorly.  Needed bil UE support for standing.                              Pertinent Vitals/Pain Pain Assessment: No/denies painVSS    Home Living Family/patient expects to be discharged to:: Skilled nursing facility Living Arrangements: Other (Comment) (Memory care unit) Available Help at Discharge: Personal care attendant (unsure of level of assist at memory care unit) Type of Home: Assisted living Home Access: Level entry     Home Layout: One level Home Equipment:  (unsure pt unable to answer questions)      Prior Function  Comments: unsure pt not able to answer questions.      Hand Dominance        Extremity/Trunk Assessment   Upper Extremity Assessment: Defer to OT evaluation           Lower Extremity Assessment: Generalized weakness      Cervical / Trunk Assessment: Kyphotic  Communication   Communication: Expressive difficulties (unreliable information)  Cognition Arousal/Alertness: Awake/alert Behavior During Therapy:  Flat affect Overall Cognitive Status: History of cognitive impairments - at baseline Area of Impairment: Memory;Orientation;Attention;Following commands;Safety/judgement;Awareness;Problem solving Orientation Level: Disoriented to;Place;Time;Situation Current Attention Level: Focused Memory: Decreased short-term memory Following Commands: Follows one step commands inconsistently Safety/Judgement: Decreased awareness of safety;Decreased awareness of deficits Awareness:  (pre intellectual) Problem Solving: Slow processing;Decreased initiation;Difficulty sequencing;Requires verbal cues;Requires tactile cues General Comments: Pt very confused and unable to have meaningful conversation    General Comments General comments (skin integrity, edema, etc.): left eye swollen shut.    Exercises General Exercises - Lower Extremity Ankle Circles/Pumps: Both;10 reps;AROM;Seated Long Arc Quad: AROM;Both;10 reps;Seated      Assessment/Plan    PT Assessment Patient needs continued PT services  PT Diagnosis Generalized weakness   PT Problem List Decreased activity tolerance;Decreased balance;Decreased mobility;Decreased knowledge of use of DME;Decreased safety awareness;Decreased knowledge of precautions  PT Treatment Interventions DME instruction;Gait training;Functional mobility training;Therapeutic activities;Therapeutic exercise;Balance training;Patient/family education   PT Goals (Current goals can be found in the Care Plan section) Acute Rehab PT Goals Patient Stated Goal: unable to assess PT Goal Formulation: Patient unable to participate in goal setting Time For Goal Achievement: 09/28/15 Potential to Achieve Goals: Good    Frequency Min 2X/week   Barriers to discharge        Co-evaluation               End of Session Equipment Utilized During Treatment: Gait belt Activity Tolerance: Patient limited by fatigue Patient left: in chair;with call bell/phone within reach;with chair  alarm set Nurse Communication: Mobility status    Functional Assessment Tool Used: clinical judgment Functional Limitation: Mobility: Walking and moving around Mobility: Walking and Moving Around Current Status [phone]): At least 20 percent but less than 40 percent impaired, limited or restricted Mobility: Walking and Moving Around Goal Status [phone]): At least 1 percent but less than 20 percent impaired, limited or restricted    Time: 0921-0930 PT Time Calculation (min) (ACUTE ONLY): 9 min   Charges:   PT Evaluation $PT Eval Moderate Complexity: 1 Procedure     PT G Codes:   PT G-Codes **NOT FOR INPATIENT CLASS** Functional Assessment Tool Used: clinical judgment Functional Limitation: Mobility: Walking and moving around Mobility: Walking and Moving Around Current Status ([id number]): At least 20 percent but less than 40 percent impaired, limited or restricted Mobility: Walking and Moving Around Goal Status [phone]): At least 1 percent but less than 20 percent impaired, limited or restricted    Tawni Millers F 09/14/2015, 11:29 AM [facility] [phone] (pager)

## 2015-09-15 LAB — GLUCOSE, CAPILLARY
GLUCOSE-CAPILLARY: 117 mg/dL — AB (ref 65–99)
GLUCOSE-CAPILLARY: 154 mg/dL — AB (ref 65–99)
GLUCOSE-CAPILLARY: 133 mg/dL — AB (ref 65–99)
Glucose-Capillary: 112 mg/dL — ABNORMAL HIGH (ref 65–99)
Glucose-Capillary: 73 mg/dL (ref 65–99)

## 2015-09-15 LAB — URINE CULTURE

## 2015-09-15 LAB — HEMOGLOBIN A1C
HEMOGLOBIN A1C: 5.7 % — AB (ref 4.8–5.6)
MEAN PLASMA GLUCOSE: 117 mg/dL

## 2015-09-15 NOTE — [place] FL2 (Signed)
[place] MEDICAID FL2 LEVEL OF CARE SCREENING TOOL     IDENTIFICATION  Patient Name: Caroline Butler Birthdate: [birth year] Sex: female Admission Date (Current Location): 09/13/2015  [facility] and [place] Number:  Producer, television/film/video and Address:  [facility], [address] [place], [place] [address]      Provider Number: [id number]  Attending Physician Name and Address:  Maretta Bees, MD  Relative Name and Phone Number:       Current Level of Care: Hospital Recommended Level of Care: Skilled Nursing Facility Prior Approval Number:    Date Approved/Denied:   PASRR Number: [id number]  Discharge Plan: SNF    Current Diagnoses: Patient Active Problem List   Diagnosis Date Noted  . Fall 09/13/2015  . Syncope 09/13/2015  . UTI (lower urinary tract infection) 09/13/2015  . Dyslipidemia 09/13/2015  . Dementia 09/13/2015    Orientation RESPIRATION BLADDER Height & Weight     Self  Normal Incontinent Weight: 80 lb 9.6 oz (36.56 kg) Height:   (149.9 cm)  BEHAVIORAL SYMPTOMS/MOOD NEUROLOGICAL BOWEL NUTRITION STATUS   (None)  (Dementia) Continent Diet (Heart healthy)  AMBULATORY STATUS COMMUNICATION OF NEEDS Skin   Limited Assist Verbally Skin abrasions (Hematoma on left forehead from fall at ALF)                       Personal Care Assistance Level of Assistance  Bathing, Feeding, Dressing Bathing Assistance: Limited assistance Feeding assistance: Independent Dressing Assistance: Limited assistance     Functional Limitations Info  Sight, Hearing, Speech Sight Info: Adequate Hearing Info: Adequate Speech Info: Adequate    SPECIAL CARE FACTORS FREQUENCY  PT (By licensed PT)                    Contractures Contractures Info: Not present    Additional Factors Info  Code Status, Allergies Code Status Info: DNR Allergies Info: NKDA           Current Medications (09/15/2015):  This is the current hospital  active medication list Current Facility-Administered Medications  Medication Dose Route Frequency Provider Last Rate Last Dose  . acetaminophen (TYLENOL) tablet 650 mg  650 mg Oral Q6H PRN Maretta Bees, MD   650 mg at 09/13/15 2353   Or  . acetaminophen (TYLENOL) suppository 650 mg  650 mg Rectal Q6H PRN Maretta Bees, MD      . allopurinol (ZYLOPRIM) tablet 100 mg  100 mg Oral Daily Maretta Bees, MD   100 mg at 09/14/15 1000  . aspirin chewable tablet 81 mg  81 mg Oral Daily Maretta Bees, MD   81 mg at 09/14/15 1000  . atorvastatin (LIPITOR) tablet 40 mg  40 mg Oral Daily Maretta Bees, MD   40 mg at 09/14/15 1000  . cefTRIAXone (ROCEPHIN) 1 g in dextrose 5 % 50 mL IVPB  1 g Intravenous Q24H Maretta Bees, MD   1 g at 09/14/15 2325  . dextrose 50 % solution 50 mL  50 mL Intravenous Q2H PRN Marily Memos, MD   50 mL at 09/13/15 1925  . enoxaparin (LOVENOX) injection 40 mg  40 mg Subcutaneous Q24H Maretta Bees, MD   40 mg at 09/14/15 2357  . feeding supplement (ENSURE ENLIVE) (ENSURE ENLIVE) liquid 237 mL  237 mL Oral BID BM Maretta Bees, MD   237 mL at 09/14/15 1515  . LORazepam (ATIVAN) tablet 0.5 mg  0.5 mg Oral BID PRN Maretta BeesShanker M Ghimire, MD      . ondansetron [facility](ZOFRAN) tablet 4 mg  4 mg Oral Q6H PRN Shanker Levora DredgeM Ghimire, MD       Or  . ondansetron [facility](ZOFRAN) injection 4 mg  4 mg Intravenous Q6H PRN Shanker Levora DredgeM Ghimire, MD      . sodium chloride flush (NS) 0.9 % injection 3 mL  3 mL Intravenous Q12H Maretta BeesShanker M Ghimire, MD   3 mL at 09/14/15 2200     Discharge Medications: Please see discharge summary for a list of discharge medications.  Relevant Imaging Results:  Relevant Lab Results:   Additional Information SS#: [id number]  Margarito LinerSarah C Emeterio Balke, LCSW

## 2015-09-15 NOTE — Progress Notes (Signed)
PROGRESS NOTE        PATIENT DETAILS Name: Caroline Butler Age: 70 y.o. Sex: female Date of Birth: Jan 29, 1946 Admit Date: 09/13/2015 Admitting Physician Dewayne Shorter Levora Dredge, MD WUJ:[id number], MD Outpatient Specialists:None  Brief Narrative: Patient is a 70 y.o. female  with medical history significant of Dementia, dyslipidemia who presented to the ED for evaluation of fall and left periorbital hematoma.  Subjective: Appears pleasantly confused-reduced swelling in her left eye  Assessment/Plan: Principal Problem: Fall: Uncertain whether this was a mechanical fall or a syncopal episode. Orthostatic vital signs positive yesterday, given IV fluids, recheck orthostatics today. Telemetry negative, echocardiogram with normal EF. Seen by PT-plans are for SNF.   Note, CT head and CT cervical spine negative.  Active Problems: UTI: Afebrile, no leukocytosis. Continue Rocephin, await cultures.  Hypoglycemia: Had one occurrence of hypoglycemia in the emergency room on admission, none since then. A1c 5.7. Continue supportive care. Doubt further workup required for just one isolated event.  Left eye periorbital hematoma: Likely secondary to fall. Swelling much improved with supportive care, CT of the orbits negative for fracture.  Dyslipidemia: Continue statin  History of gout: Continue allopurinol  Dementia with mild delirium: Supportive care. Suspect close to usual baseline.  DVT Prophylaxis: Prophylactic Lovenox   Code Status: DNR   Family Communication: Spoke with Octavia Bruckner [phone] over the phone on 5/11  Disposition Plan: Remain inpatient-back to SNF when bed available  Antimicrobial agents: IV Rocephin 5/10>>  Procedures: None  CONSULTS:  None  Time spent: 25 minutes-Greater than 50% of this time was spent in counseling, explanation of diagnosis, planning of further management, and coordination of  care.  MEDICATIONS: Anti-infectives    Start     Dose/Rate Route Frequency Ordered Stop   09/13/15 2300  cefTRIAXone (ROCEPHIN) 1 g in dextrose 5 % 50 mL IVPB    Comments:  Please given ONLY after urine culture has been collected   1 g 100 mL/hr over 30 Minutes Intravenous Every 24 hours 09/13/15 2236        Scheduled Meds: . allopurinol  100 mg Oral Daily  . aspirin  81 mg Oral Daily  . atorvastatin  40 mg Oral Daily  . cefTRIAXone (ROCEPHIN)  IV  1 g Intravenous Q24H  . enoxaparin (LOVENOX) injection  40 mg Subcutaneous Q24H  . feeding supplement (ENSURE ENLIVE)  237 mL Oral BID BM  . sodium chloride flush  3 mL Intravenous Q12H   Continuous Infusions:   PRN Meds:.acetaminophen **OR** acetaminophen, dextrose, LORazepam, ondansetron **OR** ondansetron (ZOFRAN) IV   PHYSICAL EXAM: Vital signs: Filed Vitals:   09/14/15 2044 09/15/15 0658 09/15/15 0744 09/15/15 1141  BP: 122/64 137/89  141/82  Pulse: 92 99  99  Temp: 98.3 F (36.8 C) 98.8 F (37.1 C)  98.5 F (36.9 C)  TempSrc: Oral Oral  Oral  Resp: 16 21  18   Height:   4\' 11"  (1.499 m)   Weight:  36.56 kg (80 lb 9.6 oz)    SpO2: 100% 99%  99%   Filed Weights   09/13/15 2056 09/14/15 0455 09/15/15 0658  Weight: 83 kg (182 lb 15.7 oz) 83.4 kg (183 lb 13.8 oz) 36.56 kg (80 lb 9.6 oz)   Body mass index is 16.27 kg/(m^2).   Gen Exam: Awake and pleasantly confused.  Left eye periorbital hematoma appears much improved  Neck:  Supple, No JVD.  Chest: B/L Clear.   CVS: S1 S2 Regular, no murmurs.  Abdomen: soft, BS +, non tender, non distended.  Extremities: no edema, lower extremities warm to touch. Neurologic: Non Focal.   Skin: No Rash or lesions   Wounds: N/A.    LABORATORY DATA: CBC:  Recent Labs Lab 09/13/15 1627 09/13/15 2319 09/14/15 0459  WBC 4.6 3.3* 3.0*  NEUTROABS 2.6  --   --   HGB 14.8 13.0 12.0  HCT 46.0 39.3 36.8  MCV 97.0 96.6 95.8  PLT 185 197 173    Basic Metabolic Panel:  Recent  Labs Lab 09/13/15 1430 09/13/15 2319 09/14/15 0459  NA 139  --  141  K 4.7  --  3.6  CL 105  --  103  CO2 23  --  29  GLUCOSE 66  --  78  BUN 16  --  13  CREATININE 0.97 1.06* 1.11*  CALCIUM 9.8  --  9.3    GFR: Estimated Creatinine Clearance: 26.9 mL/min (by C-G formula based on Cr of 1.11).  Liver Function Tests: No results for input(s): AST, ALT, ALKPHOS, BILITOT, PROT, ALBUMIN in the last 168 hours. No results for input(s): LIPASE, AMYLASE in the last 168 hours. No results for input(s): AMMONIA in the last 168 hours.  Coagulation Profile: No results for input(s): INR, PROTIME in the last 168 hours.  Cardiac Enzymes:  Recent Labs Lab 09/13/15 2319 09/14/15 0459  TROPONINI <0.03 <0.03    BNP (last 3 results) No results for input(s): PROBNP in the last 8760 hours.  HbA1C:  Recent Labs  09/13/15 2319  HGBA1C 5.7*    CBG:  Recent Labs Lab 09/14/15 1334 09/14/15 1722 09/14/15 2220 09/15/15 0400 09/15/15 0729  GLUCAP 104* 72 121* 112* 73    Lipid Profile: No results for input(s): CHOL, HDL, LDLCALC, TRIG, CHOLHDL, LDLDIRECT in the last 72 hours.  Thyroid Function Tests: No results for input(s): TSH, T4TOTAL, FREET4, T3FREE, THYROIDAB in the last 72 hours.  Anemia Panel: No results for input(s): VITAMINB12, FOLATE, FERRITIN, TIBC, IRON, RETICCTPCT in the last 72 hours.  Urine analysis:    Component Value Date/Time   COLORURINE YELLOW 09/13/2015 1519   APPEARANCEUR CLEAR 09/13/2015 1519   LABSPEC 1.013 09/13/2015 1519   PHURINE 7.0 09/13/2015 1519   GLUCOSEU 250* 09/13/2015 1519   HGBUR NEGATIVE 09/13/2015 1519   BILIRUBINUR NEGATIVE 09/13/2015 1519   KETONESUR NEGATIVE 09/13/2015 1519   PROTEINUR NEGATIVE 09/13/2015 1519   UROBILINOGEN 0.2 12/07/2014 1828   NITRITE NEGATIVE 09/13/2015 1519   LEUKOCYTESUR LARGE* 09/13/2015 1519    Sepsis Labs: Lactic Acid, Venous    Component Value Date/Time   LATICACIDVEN 1.53 07/13/2015 1653     MICROBIOLOGY: Recent Results (from the past 240 hour(s))  Culture, Urine     Status: Abnormal   Collection Time: 09/13/15  3:19 PM  Result Value Ref Range Status   Specimen Description URINE, RANDOM  Final   Special Requests NONE  Final   Culture MULTIPLE SPECIES PRESENT, SUGGEST RECOLLECTION (A)  Final   Report Status 09/15/2015 FINAL  Final  MRSA PCR Screening     Status: None   Collection Time: 09/14/15  1:51 AM  Result Value Ref Range Status   MRSA by PCR NEGATIVE NEGATIVE Final    Comment:        The GeneXpert MRSA Assay (FDA approved for NASAL specimens only), is one component of a comprehensive MRSA colonization surveillance program. It is not intended  to diagnose MRSA infection nor to guide or monitor treatment for MRSA infections.     RADIOLOGY STUDIES/RESULTS: Ct Head Wo Contrast  09/13/2015  CLINICAL DATA:  70 year old female with unwitnessed fall. Large hematoma about the left eye. Unknown loss of consciousness. Initial encounter. EXAM: CT HEAD WITHOUT CONTRAST CT ORBITS WITHOUT CONTRAST CT CERVICAL SPINE WITHOUT CONTRAST TECHNIQUE: Multidetector CT imaging of the head, cervical spine, and orbital structures were performed using the standard protocol without intravenous contrast. Multiplanar CT image reconstructions of the cervical spine and maxillofacial structures were also generated. COMPARISON:  HEAD CT WITHOUT CONTRAST 12/07/2014. FINDINGS: CT HEAD FINDINGS Visualized paranasal sinuses and mastoids are clear. Large, 1.5 cm thick broad-based left frontal convexity scalp hematoma is present with a small volume of subcutaneous gas. Underlying frontal bones are intact. Left frontal sinus is clear. See also orbit findings below. Other scalp soft tissues are within normal limits. No calvarium fracture. Stable cerebral volume. No ventriculomegaly. No midline shift, mass effect, or evidence of intracranial mass lesion. No acute intracranial hemorrhage identified. Patchy and  confluent white matter and deep gray matter hypodensity appears stable. No cortically based acute infarct identified. Dystrophic basal ganglia calcifications. No suspicious intracranial vascular hyperdensity. CT ORBITS FINDINGS Large broad-based left frontal region scalp hematoma tracks over the left orbit. The left globe remains intact. No intraorbital hematoma or contusion identified. Left orbital walls remain intact. Left zygoma intact. No nasal bone fracture. Visible left maxilla intact. Paranasal sinuses are clear aside from trace bilateral mucosal thickening. Negative right orbit soft tissues, sequelae of bilateral cataract surgery incidentally noted. Negative visualized noncontrast deep soft tissue spaces of the face. Bilateral EAC debris. There is mild opacification of some inferior mastoid air cells. CT CERVICAL SPINE FINDINGS Visualized skull base is intact. No atlanto-occipital dissociation. Degenerative ligamentous hypertrophy about the odontoid. C1-C2 alignment within normal limits, in those levels appear intact. Cervicothoracic junction alignment is within normal limits. Bilateral posterior element alignment is within normal limits. Moderate to severe chronic disc and endplate degeneration from C2-C3 to C6-C7. Multifactorial at least moderate degenerative spinal stenosis at C3-C4 (series 402, image 40). No cervical spine fracture identified. Grossly intact visualized upper thoracic levels. Negative lung apices. Calcified carotid bifurcation atherosclerosis. Otherwise negative noncontrast paraspinal soft tissues. IMPRESSION: 1. Left scalp soft tissue injury with large superficial scalp and periorbital hematoma. No intraorbital injury. No underlying fracture. 2. No acute traumatic injury to the brain. 3. No acute fracture or listhesis identified in the cervical spine. Ligamentous injury is not excluded. 4. Advanced cervical spine degeneration. At least moderate multifactorial degenerative spinal stenosis  suspected at C3-C4. Electronically Signed   By: Odessa Fleming M.D.   On: 09/13/2015 15:10   Ct Cervical Spine Wo Contrast  09/13/2015  CLINICAL DATA:  70 year old female with unwitnessed fall. Large hematoma about the left eye. Unknown loss of consciousness. Initial encounter. EXAM: CT HEAD WITHOUT CONTRAST CT ORBITS WITHOUT CONTRAST CT CERVICAL SPINE WITHOUT CONTRAST TECHNIQUE: Multidetector CT imaging of the head, cervical spine, and orbital structures were performed using the standard protocol without intravenous contrast. Multiplanar CT image reconstructions of the cervical spine and maxillofacial structures were also generated. COMPARISON:  HEAD CT WITHOUT CONTRAST 12/07/2014. FINDINGS: CT HEAD FINDINGS Visualized paranasal sinuses and mastoids are clear. Large, 1.5 cm thick broad-based left frontal convexity scalp hematoma is present with a small volume of subcutaneous gas. Underlying frontal bones are intact. Left frontal sinus is clear. See also orbit findings below. Other scalp soft tissues are within normal limits. No  calvarium fracture. Stable cerebral volume. No ventriculomegaly. No midline shift, mass effect, or evidence of intracranial mass lesion. No acute intracranial hemorrhage identified. Patchy and confluent white matter and deep gray matter hypodensity appears stable. No cortically based acute infarct identified. Dystrophic basal ganglia calcifications. No suspicious intracranial vascular hyperdensity. CT ORBITS FINDINGS Large broad-based left frontal region scalp hematoma tracks over the left orbit. The left globe remains intact. No intraorbital hematoma or contusion identified. Left orbital walls remain intact. Left zygoma intact. No nasal bone fracture. Visible left maxilla intact. Paranasal sinuses are clear aside from trace bilateral mucosal thickening. Negative right orbit soft tissues, sequelae of bilateral cataract surgery incidentally noted. Negative visualized noncontrast deep soft tissue  spaces of the face. Bilateral EAC debris. There is mild opacification of some inferior mastoid air cells. CT CERVICAL SPINE FINDINGS Visualized skull base is intact. No atlanto-occipital dissociation. Degenerative ligamentous hypertrophy about the odontoid. C1-C2 alignment within normal limits, in those levels appear intact. Cervicothoracic junction alignment is within normal limits. Bilateral posterior element alignment is within normal limits. Moderate to severe chronic disc and endplate degeneration from C2-C3 to C6-C7. Multifactorial at least moderate degenerative spinal stenosis at C3-C4 (series 402, image 40). No cervical spine fracture identified. Grossly intact visualized upper thoracic levels. Negative lung apices. Calcified carotid bifurcation atherosclerosis. Otherwise negative noncontrast paraspinal soft tissues. IMPRESSION: 1. Left scalp soft tissue injury with large superficial scalp and periorbital hematoma. No intraorbital injury. No underlying fracture. 2. No acute traumatic injury to the brain. 3. No acute fracture or listhesis identified in the cervical spine. Ligamentous injury is not excluded. 4. Advanced cervical spine degeneration. At least moderate multifactorial degenerative spinal stenosis suspected at C3-C4. Electronically Signed   By: Odessa Fleming M.D.   On: 09/13/2015 15:10   Dg Knee Complete 4 Views Left  09/13/2015  CLINICAL DATA:  Injury today, fell down, BILATERAL knee pain, week, history hypertension, dementia EXAM: RIGHT KNEE - COMPLETE 4+ VIEW; LEFT KNEE - COMPLETE 4+ VIEW COMPARISON:  None FINDINGS: Osseous demineralization. Minimal joint space narrowing. No acute fracture, dislocation or bone destruction. Scattered atherosclerotic calcifications. No knee joint effusion. IMPRESSION: Minimal degenerative changes without acute bony abnormalities. Electronically Signed   By: Ulyses Southward M.D.   On: 09/13/2015 14:21   Dg Knee Complete 4 Views Right  09/13/2015  CLINICAL DATA:  Injury  today, fell down, BILATERAL knee pain, week, history hypertension, dementia EXAM: RIGHT KNEE - COMPLETE 4+ VIEW; LEFT KNEE - COMPLETE 4+ VIEW COMPARISON:  None FINDINGS: Osseous demineralization. Minimal joint space narrowing. No acute fracture, dislocation or bone destruction. Scattered atherosclerotic calcifications. No knee joint effusion. IMPRESSION: Minimal degenerative changes without acute bony abnormalities. Electronically Signed   By: Ulyses Southward M.D.   On: 09/13/2015 14:21   Ct Orbitss W/o Cm  09/13/2015  CLINICAL DATA:  70 year old female with unwitnessed fall. Large hematoma about the left eye. Unknown loss of consciousness. Initial encounter. EXAM: CT HEAD WITHOUT CONTRAST CT ORBITS WITHOUT CONTRAST CT CERVICAL SPINE WITHOUT CONTRAST TECHNIQUE: Multidetector CT imaging of the head, cervical spine, and orbital structures were performed using the standard protocol without intravenous contrast. Multiplanar CT image reconstructions of the cervical spine and maxillofacial structures were also generated. COMPARISON:  HEAD CT WITHOUT CONTRAST 12/07/2014. FINDINGS: CT HEAD FINDINGS Visualized paranasal sinuses and mastoids are clear. Large, 1.5 cm thick broad-based left frontal convexity scalp hematoma is present with a small volume of subcutaneous gas. Underlying frontal bones are intact. Left frontal sinus is clear. See also orbit  findings below. Other scalp soft tissues are within normal limits. No calvarium fracture. Stable cerebral volume. No ventriculomegaly. No midline shift, mass effect, or evidence of intracranial mass lesion. No acute intracranial hemorrhage identified. Patchy and confluent white matter and deep gray matter hypodensity appears stable. No cortically based acute infarct identified. Dystrophic basal ganglia calcifications. No suspicious intracranial vascular hyperdensity. CT ORBITS FINDINGS Large broad-based left frontal region scalp hematoma tracks over the left orbit. The left globe  remains intact. No intraorbital hematoma or contusion identified. Left orbital walls remain intact. Left zygoma intact. No nasal bone fracture. Visible left maxilla intact. Paranasal sinuses are clear aside from trace bilateral mucosal thickening. Negative right orbit soft tissues, sequelae of bilateral cataract surgery incidentally noted. Negative visualized noncontrast deep soft tissue spaces of the face. Bilateral EAC debris. There is mild opacification of some inferior mastoid air cells. CT CERVICAL SPINE FINDINGS Visualized skull base is intact. No atlanto-occipital dissociation. Degenerative ligamentous hypertrophy about the odontoid. C1-C2 alignment within normal limits, in those levels appear intact. Cervicothoracic junction alignment is within normal limits. Bilateral posterior element alignment is within normal limits. Moderate to severe chronic disc and endplate degeneration from C2-C3 to C6-C7. Multifactorial at least moderate degenerative spinal stenosis at C3-C4 (series 402, image 40). No cervical spine fracture identified. Grossly intact visualized upper thoracic levels. Negative lung apices. Calcified carotid bifurcation atherosclerosis. Otherwise negative noncontrast paraspinal soft tissues. IMPRESSION: 1. Left scalp soft tissue injury with large superficial scalp and periorbital hematoma. No intraorbital injury. No underlying fracture. 2. No acute traumatic injury to the brain. 3. No acute fracture or listhesis identified in the cervical spine. Ligamentous injury is not excluded. 4. Advanced cervical spine degeneration. At least moderate multifactorial degenerative spinal stenosis suspected at C3-C4. Electronically Signed   By: Odessa Fleming M.D.   On: 09/13/2015 15:10     LOS: 1 day   Jeoffrey Massed, MD  [facility] Pager:[phone]  If 7PM-7AM, please contact night-coverage [email or web address] Password [facility] 09/15/2015, 11:44 AM

## 2015-09-15 NOTE — Clinical Documentation Improvement (Signed)
Internal Medicine   Possible Conditions?   Document Severity - Severe(third degree), Moderate (second degree), Mild (first degree)  Other condition  Unable to clinically determine  Document any associated diagnoses/conditions Please update your documentation within the medical record to reflect your response to this query. Thank you.  Supporting Information:  Initial Nutrition Assessment : by Dionne AnoWilliam M Ward, RD at 09/14/2015 2:50 PM  DOCUMENTATION CODES:  Severe malnutrition in context of chronic illness  INTERVENTION:  -Ensure Enlive po BID, each supplement provides 350 kcal and 20 grams of protein  -RD continue to monitor  NUTRITION DIAGNOSIS:  Malnutrition related to chronic illness as evidenced by severe depletion of body fat, severe depletion of muscle mass.   Please exercise your independent, professional judgment when responding. A specific answer is not anticipated or expected.  Thank You, Nevin BloodgoodJoan B Sherece Gambrill, RN, BSN, CCDS,Clinical Documentation Specialist:  [phone]  [phone]=Cell [place]- Health Information Management

## 2015-09-16 LAB — GLUCOSE, CAPILLARY
GLUCOSE-CAPILLARY: 101 mg/dL — AB (ref 65–99)
GLUCOSE-CAPILLARY: 105 mg/dL — AB (ref 65–99)
GLUCOSE-CAPILLARY: 76 mg/dL (ref 65–99)
Glucose-Capillary: 75 mg/dL (ref 65–99)
Glucose-Capillary: 109 mg/dL — ABNORMAL HIGH (ref 65–99)

## 2015-09-16 NOTE — Progress Notes (Addendum)
PROGRESS NOTE        PATIENT DETAILS Name: Caroline Butler Age: 70 y.o. Sex: female Date of Birth: November 22, 1945 Admit Date: 09/13/2015 Admitting Physician Dewayne ShorterShanker Levora DredgeM Druanne Bosques, MD ZOX:[id number]:OSEI-BONSU,GEORGE, MD Outpatient Specialists:None  Brief Narrative: Patient is a 70 y.o. female  with medical history significant of Dementia, dyslipidemia who presented to the ED for evaluation of fall and left periorbital hematoma. Found to have a UTI and started on IV antibiotics. Seen by physical therapy, recommendations are for SNF. Social work consulted, tentative discharge to SNF early next week.  Subjective: Appears pleasantly confused-reduced swelling in her left eye.  Assessment/Plan: Principal Problem: Fall: Uncertain whether this was a mechanical fall or a syncopal episode. Suspect related to orthostatic vital signs, as this was positive. Patient was hydrated IV fluids, orthostatic vital signs have resolved. Telemetry negative, echocardiogram with normal EF. Seen by physical therapy, plans are to discharge to SNF when bed available.  Note, CT head and CT cervical spine negative.  Active Problems: UTI: Afebrile, no leukocytosis. Continue Rocephin-stop date on 5/14. In culture shows multiple bacterial morphotypes, no point repeating urine culture as already on antibiotics.   Hypoglycemia: Had one occurrence of hypoglycemia in the emergency room on admission, none since then. A1c 5.7. Continue supportive care. Doubt further workup required for just one isolated event.  Left eye periorbital hematoma: Likely secondary to fall. Swelling much improved with supportive care, CT of the orbits negative for fracture.  Dyslipidemia: Continue statin  History of gout: Continue allopurinol  Dementia with mild delirium: Supportive care. Suspect close to usual baseline.  Severe malnutrition: Continue supplements  DVT Prophylaxis: Prophylactic Lovenox   Code Status: DNR    Family Communication: Spoke with Octavia BrucknerOA-Wanda Gaddy [phone] over the phone on 5/11  Disposition Plan: Remain inpatient-back to SNF when bed available-likely 5/15  Antimicrobial agents: IV Rocephin 5/10>>  Procedures: None  CONSULTS:  None  Time spent: 25 minutes-Greater than 50% of this time was spent in counseling, explanation of diagnosis, planning of further management, and coordination of care.  MEDICATIONS: Anti-infectives    Start     Dose/Rate Route Frequency Ordered Stop   09/13/15 2300  cefTRIAXone (ROCEPHIN) 1 g in dextrose 5 % 50 mL IVPB    Comments:  Please given ONLY after urine culture has been collected   1 g 100 mL/hr over 30 Minutes Intravenous Every 24 hours 09/13/15 2236        Scheduled Meds: . allopurinol  100 mg Oral Daily  . aspirin  81 mg Oral Daily  . atorvastatin  40 mg Oral Daily  . cefTRIAXone (ROCEPHIN)  IV  1 g Intravenous Q24H  . enoxaparin (LOVENOX) injection  40 mg Subcutaneous Q24H  . feeding supplement (ENSURE ENLIVE)  237 mL Oral BID BM  . sodium chloride flush  3 mL Intravenous Q12H   Continuous Infusions:   PRN Meds:.acetaminophen **OR** acetaminophen, dextrose, LORazepam, ondansetron **OR** ondansetron (ZOFRAN) IV   PHYSICAL EXAM: Vital signs: Filed Vitals:   09/15/15 2058 09/16/15 0510 09/16/15 0800 09/16/15 1230  BP: 107/86 157/87 140/80 105/65  Pulse: 98 102 98 95  Temp: 98.7 F (37.1 C) 98.6 F (37 C)  98 F (36.7 C)  TempSrc: Oral Oral  Oral  Resp: 18 16 16 18   Height:      Weight:  37.422 kg (82 lb 8 oz)  SpO2: 99% 99%  99%   Filed Weights   09/14/15 0455 09/15/15 0658 09/16/15 0510  Weight: 83.4 kg (183 lb 13.8 oz) 36.56 kg (80 lb 9.6 oz) 37.422 kg (82 lb 8 oz)   Body mass index is 16.65 kg/(m^2).   Gen Exam: Awake and pleasantly confused.  Left eye periorbital hematoma appears much improved  Neck: Supple, No JVD.  Chest: B/L Clear.   CVS: S1 S2 Regular, no murmurs.  Abdomen: soft, BS +, non  tender, non distended.  Extremities: no edema, lower extremities warm to touch. Neurologic: Non Focal.   Skin: No Rash or lesions   Wounds: N/A.    LABORATORY DATA: CBC:  Recent Labs Lab 09/13/15 1627 09/13/15 2319 09/14/15 0459  WBC 4.6 3.3* 3.0*  NEUTROABS 2.6  --   --   HGB 14.8 13.0 12.0  HCT 46.0 39.3 36.8  MCV 97.0 96.6 95.8  PLT 185 197 173    Basic Metabolic Panel:  Recent Labs Lab 09/13/15 1430 09/13/15 2319 09/14/15 0459  NA 139  --  141  K 4.7  --  3.6  CL 105  --  103  CO2 23  --  29  GLUCOSE 66  --  78  BUN 16  --  13  CREATININE 0.97 1.06* 1.11*  CALCIUM 9.8  --  9.3    GFR: Estimated Creatinine Clearance: 27.4 mL/min (by C-G formula based on Cr of 1.11).  Liver Function Tests: No results for input(s): AST, ALT, ALKPHOS, BILITOT, PROT, ALBUMIN in the last 168 hours. No results for input(s): LIPASE, AMYLASE in the last 168 hours. No results for input(s): AMMONIA in the last 168 hours.  Coagulation Profile: No results for input(s): INR, PROTIME in the last 168 hours.  Cardiac Enzymes:  Recent Labs Lab 09/13/15 2319 09/14/15 0459  TROPONINI <0.03 <0.03    BNP (last 3 results) No results for input(s): PROBNP in the last 8760 hours.  HbA1C:  Recent Labs  09/13/15 2319  HGBA1C 5.7*    CBG:  Recent Labs Lab 09/15/15 2053 09/16/15 0047 09/16/15 0509 09/16/15 0827 09/16/15 1235  GLUCAP 133* 101* 76 75 109*    Lipid Profile: No results for input(s): CHOL, HDL, LDLCALC, TRIG, CHOLHDL, LDLDIRECT in the last 72 hours.  Thyroid Function Tests: No results for input(s): TSH, T4TOTAL, FREET4, T3FREE, THYROIDAB in the last 72 hours.  Anemia Panel: No results for input(s): VITAMINB12, FOLATE, FERRITIN, TIBC, IRON, RETICCTPCT in the last 72 hours.  Urine analysis:    Component Value Date/Time   COLORURINE YELLOW 09/13/2015 1519   APPEARANCEUR CLEAR 09/13/2015 1519   LABSPEC 1.013 09/13/2015 1519   PHURINE 7.0 09/13/2015 1519    GLUCOSEU 250* 09/13/2015 1519   HGBUR NEGATIVE 09/13/2015 1519   BILIRUBINUR NEGATIVE 09/13/2015 1519   KETONESUR NEGATIVE 09/13/2015 1519   PROTEINUR NEGATIVE 09/13/2015 1519   UROBILINOGEN 0.2 12/07/2014 1828   NITRITE NEGATIVE 09/13/2015 1519   LEUKOCYTESUR LARGE* 09/13/2015 1519    Sepsis Labs: Lactic Acid, Venous    Component Value Date/Time   LATICACIDVEN 1.53 07/13/2015 1653    MICROBIOLOGY: Recent Results (from the past 240 hour(s))  Culture, Urine     Status: Abnormal   Collection Time: 09/13/15  3:19 PM  Result Value Ref Range Status   Specimen Description URINE, RANDOM  Final   Special Requests NONE  Final   Culture MULTIPLE SPECIES PRESENT, SUGGEST RECOLLECTION (A)  Final   Report Status 09/15/2015 FINAL  Final  MRSA PCR Screening  Status: None   Collection Time: 09/14/15  1:51 AM  Result Value Ref Range Status   MRSA by PCR NEGATIVE NEGATIVE Final    Comment:        The GeneXpert MRSA Assay (FDA approved for NASAL specimens only), is one component of a comprehensive MRSA colonization surveillance program. It is not intended to diagnose MRSA infection nor to guide or monitor treatment for MRSA infections.     RADIOLOGY STUDIES/RESULTS: Ct Head Wo Contrast  09/13/2015  CLINICAL DATA:  70 year old female with unwitnessed fall. Large hematoma about the left eye. Unknown loss of consciousness. Initial encounter. EXAM: CT HEAD WITHOUT CONTRAST CT ORBITS WITHOUT CONTRAST CT CERVICAL SPINE WITHOUT CONTRAST TECHNIQUE: Multidetector CT imaging of the head, cervical spine, and orbital structures were performed using the standard protocol without intravenous contrast. Multiplanar CT image reconstructions of the cervical spine and maxillofacial structures were also generated. COMPARISON:  HEAD CT WITHOUT CONTRAST 12/07/2014. FINDINGS: CT HEAD FINDINGS Visualized paranasal sinuses and mastoids are clear. Large, 1.5 cm thick broad-based left frontal convexity scalp  hematoma is present with a small volume of subcutaneous gas. Underlying frontal bones are intact. Left frontal sinus is clear. See also orbit findings below. Other scalp soft tissues are within normal limits. No calvarium fracture. Stable cerebral volume. No ventriculomegaly. No midline shift, mass effect, or evidence of intracranial mass lesion. No acute intracranial hemorrhage identified. Patchy and confluent white matter and deep gray matter hypodensity appears stable. No cortically based acute infarct identified. Dystrophic basal ganglia calcifications. No suspicious intracranial vascular hyperdensity. CT ORBITS FINDINGS Large broad-based left frontal region scalp hematoma tracks over the left orbit. The left globe remains intact. No intraorbital hematoma or contusion identified. Left orbital walls remain intact. Left zygoma intact. No nasal bone fracture. Visible left maxilla intact. Paranasal sinuses are clear aside from trace bilateral mucosal thickening. Negative right orbit soft tissues, sequelae of bilateral cataract surgery incidentally noted. Negative visualized noncontrast deep soft tissue spaces of the face. Bilateral EAC debris. There is mild opacification of some inferior mastoid air cells. CT CERVICAL SPINE FINDINGS Visualized skull base is intact. No atlanto-occipital dissociation. Degenerative ligamentous hypertrophy about the odontoid. C1-C2 alignment within normal limits, in those levels appear intact. Cervicothoracic junction alignment is within normal limits. Bilateral posterior element alignment is within normal limits. Moderate to severe chronic disc and endplate degeneration from C2-C3 to C6-C7. Multifactorial at least moderate degenerative spinal stenosis at C3-C4 (series 402, image 40). No cervical spine fracture identified. Grossly intact visualized upper thoracic levels. Negative lung apices. Calcified carotid bifurcation atherosclerosis. Otherwise negative noncontrast paraspinal soft  tissues. IMPRESSION: 1. Left scalp soft tissue injury with large superficial scalp and periorbital hematoma. No intraorbital injury. No underlying fracture. 2. No acute traumatic injury to the brain. 3. No acute fracture or listhesis identified in the cervical spine. Ligamentous injury is not excluded. 4. Advanced cervical spine degeneration. At least moderate multifactorial degenerative spinal stenosis suspected at C3-C4. Electronically Signed   By: Odessa Fleming M.D.   On: 09/13/2015 15:10   Ct Cervical Spine Wo Contrast  09/13/2015  CLINICAL DATA:  70 year old female with unwitnessed fall. Large hematoma about the left eye. Unknown loss of consciousness. Initial encounter. EXAM: CT HEAD WITHOUT CONTRAST CT ORBITS WITHOUT CONTRAST CT CERVICAL SPINE WITHOUT CONTRAST TECHNIQUE: Multidetector CT imaging of the head, cervical spine, and orbital structures were performed using the standard protocol without intravenous contrast. Multiplanar CT image reconstructions of the cervical spine and maxillofacial structures were also generated. COMPARISON:  HEAD CT WITHOUT CONTRAST 12/07/2014. FINDINGS: CT HEAD FINDINGS Visualized paranasal sinuses and mastoids are clear. Large, 1.5 cm thick broad-based left frontal convexity scalp hematoma is present with a small volume of subcutaneous gas. Underlying frontal bones are intact. Left frontal sinus is clear. See also orbit findings below. Other scalp soft tissues are within normal limits. No calvarium fracture. Stable cerebral volume. No ventriculomegaly. No midline shift, mass effect, or evidence of intracranial mass lesion. No acute intracranial hemorrhage identified. Patchy and confluent white matter and deep gray matter hypodensity appears stable. No cortically based acute infarct identified. Dystrophic basal ganglia calcifications. No suspicious intracranial vascular hyperdensity. CT ORBITS FINDINGS Large broad-based left frontal region scalp hematoma tracks over the left orbit.  The left globe remains intact. No intraorbital hematoma or contusion identified. Left orbital walls remain intact. Left zygoma intact. No nasal bone fracture. Visible left maxilla intact. Paranasal sinuses are clear aside from trace bilateral mucosal thickening. Negative right orbit soft tissues, sequelae of bilateral cataract surgery incidentally noted. Negative visualized noncontrast deep soft tissue spaces of the face. Bilateral EAC debris. There is mild opacification of some inferior mastoid air cells. CT CERVICAL SPINE FINDINGS Visualized skull base is intact. No atlanto-occipital dissociation. Degenerative ligamentous hypertrophy about the odontoid. C1-C2 alignment within normal limits, in those levels appear intact. Cervicothoracic junction alignment is within normal limits. Bilateral posterior element alignment is within normal limits. Moderate to severe chronic disc and endplate degeneration from C2-C3 to C6-C7. Multifactorial at least moderate degenerative spinal stenosis at C3-C4 (series 402, image 40). No cervical spine fracture identified. Grossly intact visualized upper thoracic levels. Negative lung apices. Calcified carotid bifurcation atherosclerosis. Otherwise negative noncontrast paraspinal soft tissues. IMPRESSION: 1. Left scalp soft tissue injury with large superficial scalp and periorbital hematoma. No intraorbital injury. No underlying fracture. 2. No acute traumatic injury to the brain. 3. No acute fracture or listhesis identified in the cervical spine. Ligamentous injury is not excluded. 4. Advanced cervical spine degeneration. At least moderate multifactorial degenerative spinal stenosis suspected at C3-C4. Electronically Signed   By: Odessa Fleming M.D.   On: 09/13/2015 15:10   Dg Knee Complete 4 Views Left  09/13/2015  CLINICAL DATA:  Injury today, fell down, BILATERAL knee pain, week, history hypertension, dementia EXAM: RIGHT KNEE - COMPLETE 4+ VIEW; LEFT KNEE - COMPLETE 4+ VIEW COMPARISON:   None FINDINGS: Osseous demineralization. Minimal joint space narrowing. No acute fracture, dislocation or bone destruction. Scattered atherosclerotic calcifications. No knee joint effusion. IMPRESSION: Minimal degenerative changes without acute bony abnormalities. Electronically Signed   By: Ulyses Southward M.D.   On: 09/13/2015 14:21   Dg Knee Complete 4 Views Right  09/13/2015  CLINICAL DATA:  Injury today, fell down, BILATERAL knee pain, week, history hypertension, dementia EXAM: RIGHT KNEE - COMPLETE 4+ VIEW; LEFT KNEE - COMPLETE 4+ VIEW COMPARISON:  None FINDINGS: Osseous demineralization. Minimal joint space narrowing. No acute fracture, dislocation or bone destruction. Scattered atherosclerotic calcifications. No knee joint effusion. IMPRESSION: Minimal degenerative changes without acute bony abnormalities. Electronically Signed   By: Ulyses Southward M.D.   On: 09/13/2015 14:21   Ct Orbitss W/o Cm  09/13/2015  CLINICAL DATA:  70 year old female with unwitnessed fall. Large hematoma about the left eye. Unknown loss of consciousness. Initial encounter. EXAM: CT HEAD WITHOUT CONTRAST CT ORBITS WITHOUT CONTRAST CT CERVICAL SPINE WITHOUT CONTRAST TECHNIQUE: Multidetector CT imaging of the head, cervical spine, and orbital structures were performed using the standard protocol without intravenous contrast. Multiplanar CT image reconstructions of  the cervical spine and maxillofacial structures were also generated. COMPARISON:  HEAD CT WITHOUT CONTRAST 12/07/2014. FINDINGS: CT HEAD FINDINGS Visualized paranasal sinuses and mastoids are clear. Large, 1.5 cm thick broad-based left frontal convexity scalp hematoma is present with a small volume of subcutaneous gas. Underlying frontal bones are intact. Left frontal sinus is clear. See also orbit findings below. Other scalp soft tissues are within normal limits. No calvarium fracture. Stable cerebral volume. No ventriculomegaly. No midline shift, mass effect, or evidence of  intracranial mass lesion. No acute intracranial hemorrhage identified. Patchy and confluent white matter and deep gray matter hypodensity appears stable. No cortically based acute infarct identified. Dystrophic basal ganglia calcifications. No suspicious intracranial vascular hyperdensity. CT ORBITS FINDINGS Large broad-based left frontal region scalp hematoma tracks over the left orbit. The left globe remains intact. No intraorbital hematoma or contusion identified. Left orbital walls remain intact. Left zygoma intact. No nasal bone fracture. Visible left maxilla intact. Paranasal sinuses are clear aside from trace bilateral mucosal thickening. Negative right orbit soft tissues, sequelae of bilateral cataract surgery incidentally noted. Negative visualized noncontrast deep soft tissue spaces of the face. Bilateral EAC debris. There is mild opacification of some inferior mastoid air cells. CT CERVICAL SPINE FINDINGS Visualized skull base is intact. No atlanto-occipital dissociation. Degenerative ligamentous hypertrophy about the odontoid. C1-C2 alignment within normal limits, in those levels appear intact. Cervicothoracic junction alignment is within normal limits. Bilateral posterior element alignment is within normal limits. Moderate to severe chronic disc and endplate degeneration from C2-C3 to C6-C7. Multifactorial at least moderate degenerative spinal stenosis at C3-C4 (series 402, image 40). No cervical spine fracture identified. Grossly intact visualized upper thoracic levels. Negative lung apices. Calcified carotid bifurcation atherosclerosis. Otherwise negative noncontrast paraspinal soft tissues. IMPRESSION: 1. Left scalp soft tissue injury with large superficial scalp and periorbital hematoma. No intraorbital injury. No underlying fracture. 2. No acute traumatic injury to the brain. 3. No acute fracture or listhesis identified in the cervical spine. Ligamentous injury is not excluded. 4. Advanced cervical  spine degeneration. At least moderate multifactorial degenerative spinal stenosis suspected at C3-C4. Electronically Signed   By: Odessa Fleming M.D.   On: 09/13/2015 15:10     LOS: 2 days   Jeoffrey Massed, MD  [facility] Pager:[phone]  If 7PM-7AM, please contact night-coverage [email or web address] Password [email or web address] 09/16/2015, 2:54 PM

## 2015-09-17 DIAGNOSIS — R55 Syncope and collapse: Secondary | ICD-10-CM

## 2015-09-17 LAB — GLUCOSE, CAPILLARY
GLUCOSE-CAPILLARY: 90 mg/dL (ref 65–99)
GLUCOSE-CAPILLARY: 69 mg/dL (ref 65–99)
GLUCOSE-CAPILLARY: 94 mg/dL (ref 65–99)
Glucose-Capillary: 102 mg/dL — ABNORMAL HIGH (ref 65–99)
Glucose-Capillary: 84 mg/dL (ref 65–99)

## 2015-09-17 NOTE — Progress Notes (Signed)
PROGRESS NOTE        PATIENT DETAILS Name: Caroline Butler Age: 70 y.o. Sex: female Date of Birth: 04-23-1946 Admit Date: 09/13/2015 Admitting Physician Dewayne Shorter Levora Dredge, MD NWG:[id number],VHQION, MD Outpatient Specialists:None  Brief Narrative: Patient is a 70 y.o. female  with medical history significant of Dementia, dyslipidemia who presented to the ED for evaluation of fall and left periorbital hematoma. Found to have a UTI and started on IV antibiotics. Seen by physical therapy, recommendations are for SNF. Social work consulted, tentative discharge to SNF early next week.  Subjective: Appears pleasantly confused-denies any headache, no chest-abd pain.  Assessment/Plan:   Fall: Uncertain whether this was a mechanical fall or a syncopal episode. Likely due to orthostatic vital signs, as this was positive. Patient was hydrated IV fluids, orthostatic vital signs have resolved. Telemetry negative, echocardiogram with normal EF. Seen by physical therapy, plans are to discharge to SNF when bed available.  Note, CT head and CT cervical spine negative.  Active Problems: UTI: Afebrile, no leukocytosis. Continue Rocephin- stopped 5-17 . In culture shows multiple bacterial morphotypes, no point repeating urine culture as already on antibiotics.   Hypoglycemia: Had one occurrence of hypoglycemia in the emergency room on admission, none since then. A1c 5.7. Continue supportive care. Doubt further workup required for just one isolated event.  CBG (last 3)   Recent Labs  09/17/15 0437 09/17/15 0632 09/17/15 0840  GLUCAP 90 69 84    Left eye periorbital hematoma: Likely secondary to fall. Swelling much improved with supportive care, CT of the orbits negative for fracture.  Dyslipidemia: Continue statin  History of gout: Continue allopurinol  Dementia with mild delirium: Supportive care. Suspect close to usual baseline.  Severe malnutrition: Continue  supplements  DVT Prophylaxis: Prophylactic Lovenox   Code Status: DNR   Family Communication: Spoke with Octavia Bruckner [phone] over the phone on 5/11  Disposition Plan: SNF 5/15  Antimicrobial agents: IV Rocephin 5/10>>5/14  Procedures: None  CONSULTS:  None  Time spent: 25 minutes-Greater than 50% of this time was spent in counseling, explanation of diagnosis, planning of further management, and coordination of care.  MEDICATIONS: Anti-infectives    Start     Dose/Rate Route Frequency Ordered Stop   09/13/15 2300  cefTRIAXone (ROCEPHIN) 1 g in dextrose 5 % 50 mL IVPB    Comments:  Please given ONLY after urine culture has been collected   1 g 100 mL/hr over 30 Minutes Intravenous Every 24 hours 09/13/15 2236        Scheduled Meds: . allopurinol  100 mg Oral Daily  . aspirin  81 mg Oral Daily  . atorvastatin  40 mg Oral Daily  . cefTRIAXone (ROCEPHIN)  IV  1 g Intravenous Q24H  . enoxaparin (LOVENOX) injection  40 mg Subcutaneous Q24H  . feeding supplement (ENSURE ENLIVE)  237 mL Oral BID BM  . sodium chloride flush  3 mL Intravenous Q12H   Continuous Infusions:   PRN Meds:.acetaminophen **OR** acetaminophen, dextrose, LORazepam, ondansetron **OR** ondansetron (ZOFRAN) IV   PHYSICAL EXAM: Vital signs: Filed Vitals:   09/16/15 2121 09/17/15 0505 09/17/15 0636 09/17/15 0933  BP: 119/94  161/94 140/80  Pulse: 87  94 92  Temp: 98.3 F (36.8 C)  98.4 F (36.9 C)   TempSrc: Oral  Oral   Resp: 16  20   Height:  Weight:  39.009 kg (86 lb)    SpO2: 100%  100% 100%   Filed Weights   09/15/15 0658 09/16/15 0510 09/17/15 0505  Weight: 36.56 kg (80 lb 9.6 oz) 37.422 kg (82 lb 8 oz) 39.009 kg (86 lb)   Body mass index is 17.36 kg/(m^2).   Gen Exam: Awake and pleasantly confused.  Left eye periorbital hematoma appears much improved , Frontal scalp bruise with mild swelling Neck: Supple, No JVD.  Chest: B/L Clear.   CVS: S1 S2 Regular, no murmurs.    Abdomen: soft, BS +, non tender, non distended.  Extremities: no edema, lower extremities warm to touch. Neurologic: Non Focal.   Skin: No Rash or lesions   Wounds: N/A.    LABORATORY DATA: CBC:  Recent Labs Lab 09/13/15 1627 09/13/15 2319 09/14/15 0459  WBC 4.6 3.3* 3.0*  NEUTROABS 2.6  --   --   HGB 14.8 13.0 12.0  HCT 46.0 39.3 36.8  MCV 97.0 96.6 95.8  PLT 185 197 173    Basic Metabolic Panel:  Recent Labs Lab 09/13/15 1430 09/13/15 2319 09/14/15 0459  NA 139  --  141  K 4.7  --  3.6  CL 105  --  103  CO2 23  --  29  GLUCOSE 66  --  78  BUN 16  --  13  CREATININE 0.97 1.06* 1.11*  CALCIUM 9.8  --  9.3    GFR: Estimated Creatinine Clearance: 28.6 mL/min (by C-G formula based on Cr of 1.11).  Liver Function Tests: No results for input(s): AST, ALT, ALKPHOS, BILITOT, PROT, ALBUMIN in the last 168 hours. No results for input(s): LIPASE, AMYLASE in the last 168 hours. No results for input(s): AMMONIA in the last 168 hours.  Coagulation Profile: No results for input(s): INR, PROTIME in the last 168 hours.  Cardiac Enzymes:  Recent Labs Lab 09/13/15 2319 09/14/15 0459  TROPONINI <0.03 <0.03    BNP (last 3 results) No results for input(s): PROBNP in the last 8760 hours.  HbA1C: No results for input(s): HGBA1C in the last 72 hours.  CBG:  Recent Labs Lab 09/16/15 1235 09/16/15 1632 09/17/15 0437 09/17/15 0632 09/17/15 0840  GLUCAP 109* 105* 90 69 84    Lipid Profile: No results for input(s): CHOL, HDL, LDLCALC, TRIG, CHOLHDL, LDLDIRECT in the last 72 hours.  Thyroid Function Tests: No results for input(s): TSH, T4TOTAL, FREET4, T3FREE, THYROIDAB in the last 72 hours.  Anemia Panel: No results for input(s): VITAMINB12, FOLATE, FERRITIN, TIBC, IRON, RETICCTPCT in the last 72 hours.  Urine analysis:    Component Value Date/Time   COLORURINE YELLOW 09/13/2015 1519   APPEARANCEUR CLEAR 09/13/2015 1519   LABSPEC 1.013 09/13/2015 1519    PHURINE 7.0 09/13/2015 1519   GLUCOSEU 250* 09/13/2015 1519   HGBUR NEGATIVE 09/13/2015 1519   BILIRUBINUR NEGATIVE 09/13/2015 1519   KETONESUR NEGATIVE 09/13/2015 1519   PROTEINUR NEGATIVE 09/13/2015 1519   UROBILINOGEN 0.2 12/07/2014 1828   NITRITE NEGATIVE 09/13/2015 1519   LEUKOCYTESUR LARGE* 09/13/2015 1519    Sepsis Labs: Lactic Acid, Venous    Component Value Date/Time   LATICACIDVEN 1.53 07/13/2015 1653    MICROBIOLOGY: Recent Results (from the past 240 hour(s))  Culture, Urine     Status: Abnormal   Collection Time: 09/13/15  3:19 PM  Result Value Ref Range Status   Specimen Description URINE, RANDOM  Final   Special Requests NONE  Final   Culture MULTIPLE SPECIES PRESENT, SUGGEST RECOLLECTION (A)  Final   Report Status 09/15/2015 FINAL  Final  MRSA PCR Screening     Status: None   Collection Time: 09/14/15  1:51 AM  Result Value Ref Range Status   MRSA by PCR NEGATIVE NEGATIVE Final    Comment:        The GeneXpert MRSA Assay (FDA approved for NASAL specimens only), is one component of a comprehensive MRSA colonization surveillance program. It is not intended to diagnose MRSA infection nor to guide or monitor treatment for MRSA infections.     RADIOLOGY STUDIES/RESULTS: Ct Head Wo Contrast  09/13/2015  CLINICAL DATA:  70 year old female with unwitnessed fall. Large hematoma about the left eye. Unknown loss of consciousness. Initial encounter. EXAM: CT HEAD WITHOUT CONTRAST CT ORBITS WITHOUT CONTRAST CT CERVICAL SPINE WITHOUT CONTRAST TECHNIQUE: Multidetector CT imaging of the head, cervical spine, and orbital structures were performed using the standard protocol without intravenous contrast. Multiplanar CT image reconstructions of the cervical spine and maxillofacial structures were also generated. COMPARISON:  HEAD CT WITHOUT CONTRAST 12/07/2014. FINDINGS: CT HEAD FINDINGS Visualized paranasal sinuses and mastoids are clear. Large, 1.5 cm thick broad-based  left frontal convexity scalp hematoma is present with a small volume of subcutaneous gas. Underlying frontal bones are intact. Left frontal sinus is clear. See also orbit findings below. Other scalp soft tissues are within normal limits. No calvarium fracture. Stable cerebral volume. No ventriculomegaly. No midline shift, mass effect, or evidence of intracranial mass lesion. No acute intracranial hemorrhage identified. Patchy and confluent white matter and deep gray matter hypodensity appears stable. No cortically based acute infarct identified. Dystrophic basal ganglia calcifications. No suspicious intracranial vascular hyperdensity. CT ORBITS FINDINGS Large broad-based left frontal region scalp hematoma tracks over the left orbit. The left globe remains intact. No intraorbital hematoma or contusion identified. Left orbital walls remain intact. Left zygoma intact. No nasal bone fracture. Visible left maxilla intact. Paranasal sinuses are clear aside from trace bilateral mucosal thickening. Negative right orbit soft tissues, sequelae of bilateral cataract surgery incidentally noted. Negative visualized noncontrast deep soft tissue spaces of the face. Bilateral EAC debris. There is mild opacification of some inferior mastoid air cells. CT CERVICAL SPINE FINDINGS Visualized skull base is intact. No atlanto-occipital dissociation. Degenerative ligamentous hypertrophy about the odontoid. C1-C2 alignment within normal limits, in those levels appear intact. Cervicothoracic junction alignment is within normal limits. Bilateral posterior element alignment is within normal limits. Moderate to severe chronic disc and endplate degeneration from C2-C3 to C6-C7. Multifactorial at least moderate degenerative spinal stenosis at C3-C4 (series 402, image 40). No cervical spine fracture identified. Grossly intact visualized upper thoracic levels. Negative lung apices. Calcified carotid bifurcation atherosclerosis. Otherwise negative  noncontrast paraspinal soft tissues. IMPRESSION: 1. Left scalp soft tissue injury with large superficial scalp and periorbital hematoma. No intraorbital injury. No underlying fracture. 2. No acute traumatic injury to the brain. 3. No acute fracture or listhesis identified in the cervical spine. Ligamentous injury is not excluded. 4. Advanced cervical spine degeneration. At least moderate multifactorial degenerative spinal stenosis suspected at C3-C4. Electronically Signed   By: Odessa Fleming M.D.   On: 09/13/2015 15:10   Ct Cervical Spine Wo Contrast  09/13/2015  CLINICAL DATA:  70 year old female with unwitnessed fall. Large hematoma about the left eye. Unknown loss of consciousness. Initial encounter. EXAM: CT HEAD WITHOUT CONTRAST CT ORBITS WITHOUT CONTRAST CT CERVICAL SPINE WITHOUT CONTRAST TECHNIQUE: Multidetector CT imaging of the head, cervical spine, and orbital structures were performed using the standard protocol without intravenous  contrast. Multiplanar CT image reconstructions of the cervical spine and maxillofacial structures were also generated. COMPARISON:  HEAD CT WITHOUT CONTRAST 12/07/2014. FINDINGS: CT HEAD FINDINGS Visualized paranasal sinuses and mastoids are clear. Large, 1.5 cm thick broad-based left frontal convexity scalp hematoma is present with a small volume of subcutaneous gas. Underlying frontal bones are intact. Left frontal sinus is clear. See also orbit findings below. Other scalp soft tissues are within normal limits. No calvarium fracture. Stable cerebral volume. No ventriculomegaly. No midline shift, mass effect, or evidence of intracranial mass lesion. No acute intracranial hemorrhage identified. Patchy and confluent white matter and deep gray matter hypodensity appears stable. No cortically based acute infarct identified. Dystrophic basal ganglia calcifications. No suspicious intracranial vascular hyperdensity. CT ORBITS FINDINGS Large broad-based left frontal region scalp hematoma  tracks over the left orbit. The left globe remains intact. No intraorbital hematoma or contusion identified. Left orbital walls remain intact. Left zygoma intact. No nasal bone fracture. Visible left maxilla intact. Paranasal sinuses are clear aside from trace bilateral mucosal thickening. Negative right orbit soft tissues, sequelae of bilateral cataract surgery incidentally noted. Negative visualized noncontrast deep soft tissue spaces of the face. Bilateral EAC debris. There is mild opacification of some inferior mastoid air cells. CT CERVICAL SPINE FINDINGS Visualized skull base is intact. No atlanto-occipital dissociation. Degenerative ligamentous hypertrophy about the odontoid. C1-C2 alignment within normal limits, in those levels appear intact. Cervicothoracic junction alignment is within normal limits. Bilateral posterior element alignment is within normal limits. Moderate to severe chronic disc and endplate degeneration from C2-C3 to C6-C7. Multifactorial at least moderate degenerative spinal stenosis at C3-C4 (series 402, image 40). No cervical spine fracture identified. Grossly intact visualized upper thoracic levels. Negative lung apices. Calcified carotid bifurcation atherosclerosis. Otherwise negative noncontrast paraspinal soft tissues. IMPRESSION: 1. Left scalp soft tissue injury with large superficial scalp and periorbital hematoma. No intraorbital injury. No underlying fracture. 2. No acute traumatic injury to the brain. 3. No acute fracture or listhesis identified in the cervical spine. Ligamentous injury is not excluded. 4. Advanced cervical spine degeneration. At least moderate multifactorial degenerative spinal stenosis suspected at C3-C4. Electronically Signed   By: Odessa Fleming M.D.   On: 09/13/2015 15:10   Dg Knee Complete 4 Views Left  09/13/2015  CLINICAL DATA:  Injury today, fell down, BILATERAL knee pain, week, history hypertension, dementia EXAM: RIGHT KNEE - COMPLETE 4+ VIEW; LEFT KNEE -  COMPLETE 4+ VIEW COMPARISON:  None FINDINGS: Osseous demineralization. Minimal joint space narrowing. No acute fracture, dislocation or bone destruction. Scattered atherosclerotic calcifications. No knee joint effusion. IMPRESSION: Minimal degenerative changes without acute bony abnormalities. Electronically Signed   By: Ulyses Southward M.D.   On: 09/13/2015 14:21   Dg Knee Complete 4 Views Right  09/13/2015  CLINICAL DATA:  Injury today, fell down, BILATERAL knee pain, week, history hypertension, dementia EXAM: RIGHT KNEE - COMPLETE 4+ VIEW; LEFT KNEE - COMPLETE 4+ VIEW COMPARISON:  None FINDINGS: Osseous demineralization. Minimal joint space narrowing. No acute fracture, dislocation or bone destruction. Scattered atherosclerotic calcifications. No knee joint effusion. IMPRESSION: Minimal degenerative changes without acute bony abnormalities. Electronically Signed   By: Ulyses Southward M.D.   On: 09/13/2015 14:21   Ct Orbitss W/o Cm  09/13/2015  CLINICAL DATA:  70 year old female with unwitnessed fall. Large hematoma about the left eye. Unknown loss of consciousness. Initial encounter. EXAM: CT HEAD WITHOUT CONTRAST CT ORBITS WITHOUT CONTRAST CT CERVICAL SPINE WITHOUT CONTRAST TECHNIQUE: Multidetector CT imaging of the head, cervical spine,  and orbital structures were performed using the standard protocol without intravenous contrast. Multiplanar CT image reconstructions of the cervical spine and maxillofacial structures were also generated. COMPARISON:  HEAD CT WITHOUT CONTRAST 12/07/2014. FINDINGS: CT HEAD FINDINGS Visualized paranasal sinuses and mastoids are clear. Large, 1.5 cm thick broad-based left frontal convexity scalp hematoma is present with a small volume of subcutaneous gas. Underlying frontal bones are intact. Left frontal sinus is clear. See also orbit findings below. Other scalp soft tissues are within normal limits. No calvarium fracture. Stable cerebral volume. No ventriculomegaly. No midline  shift, mass effect, or evidence of intracranial mass lesion. No acute intracranial hemorrhage identified. Patchy and confluent white matter and deep gray matter hypodensity appears stable. No cortically based acute infarct identified. Dystrophic basal ganglia calcifications. No suspicious intracranial vascular hyperdensity. CT ORBITS FINDINGS Large broad-based left frontal region scalp hematoma tracks over the left orbit. The left globe remains intact. No intraorbital hematoma or contusion identified. Left orbital walls remain intact. Left zygoma intact. No nasal bone fracture. Visible left maxilla intact. Paranasal sinuses are clear aside from trace bilateral mucosal thickening. Negative right orbit soft tissues, sequelae of bilateral cataract surgery incidentally noted. Negative visualized noncontrast deep soft tissue spaces of the face. Bilateral EAC debris. There is mild opacification of some inferior mastoid air cells. CT CERVICAL SPINE FINDINGS Visualized skull base is intact. No atlanto-occipital dissociation. Degenerative ligamentous hypertrophy about the odontoid. C1-C2 alignment within normal limits, in those levels appear intact. Cervicothoracic junction alignment is within normal limits. Bilateral posterior element alignment is within normal limits. Moderate to severe chronic disc and endplate degeneration from C2-C3 to C6-C7. Multifactorial at least moderate degenerative spinal stenosis at C3-C4 (series 402, image 40). No cervical spine fracture identified. Grossly intact visualized upper thoracic levels. Negative lung apices. Calcified carotid bifurcation atherosclerosis. Otherwise negative noncontrast paraspinal soft tissues. IMPRESSION: 1. Left scalp soft tissue injury with large superficial scalp and periorbital hematoma. No intraorbital injury. No underlying fracture. 2. No acute traumatic injury to the brain. 3. No acute fracture or listhesis identified in the cervical spine. Ligamentous injury is  not excluded. 4. Advanced cervical spine degeneration. At least moderate multifactorial degenerative spinal stenosis suspected at C3-C4. Electronically Signed   By: Odessa Fleming M.D.   On: 09/13/2015 15:10     LOS: 3 days   Leroy Sea, MD  [facility] Pager:[phone]  If 7PM-7AM, please contact night-coverage [email or web address] Password [facility] 09/17/2015, 9:52 AM

## 2015-09-18 LAB — GLUCOSE, CAPILLARY
GLUCOSE-CAPILLARY: 79 mg/dL (ref 65–99)
GLUCOSE-CAPILLARY: 112 mg/dL — AB (ref 65–99)
GLUCOSE-CAPILLARY: 85 mg/dL (ref 65–99)

## 2015-09-18 MED ORDER — ENSURE ENLIVE PO LIQD: 237.0000 mL | Freq: Two times a day (BID) | ORAL | Status: AC

## 2015-09-18 MED ORDER — LORAZEPAM 0.5 MG PO TABS: 0.5000 mg | ORAL_TABLET | Freq: Two times a day (BID) | ORAL | Status: AC | PRN

## 2015-09-18 NOTE — Care Management Important Message (Signed)
Important Message  Patient Details  Name: Melody HaverMargaret Monarrez MRN: [id number] Date of Birth: November 04, 1945   Medicare Important Message Given:  Yes    Elliot CousinShavis, Sher Hellinger Ellen, RN 09/18/2015, 10:24 AM

## 2015-09-18 NOTE — Care Management Note (Signed)
Case Management Note  Patient Details  Name: Caroline HaverMargaret Butler MRN: [id number] Date of Birth: [birth year]  Subjective/Objective:       Fall, syncope             Action/Plan: Discharge Planning:  Chart review, CSW arranged SNF-rehab. No NCM needs identified.   Expected Discharge Date:  09/18/2015              Expected Discharge Plan:  Skilled Nursing Facility  In-House Referral:  Clinical Social Work  Discharge planning Services  CM Consult  Post Acute Care Choice:  NA Choice offered to:  NA  DME Arranged:  N/A DME Agency:  NA  HH Arranged:  NA HH Agency:  NA  Status of Service:  Completed, signed off  Medicare Important Message Given:  Yes Date Medicare IM Given:    Medicare IM give by:    Date Additional Medicare IM Given:    Additional Medicare Important Message give by:     If discussed at Long Length of Stay Meetings, dates discussed:    Additional Comments:  Caroline Butler, Caroline Radloff Ellen, RN 09/18/2015, 10:30 AM

## 2015-09-18 NOTE — Progress Notes (Signed)
Discharged with daughter via private vehicle.

## 2015-09-18 NOTE — Discharge Instructions (Signed)
Follow with Primary MD OSEI-BONSU,GEORGE, MD in 7 days   Get CBC, CMP, 2 view Chest X ray checked  by Primary MD next visit.    Activity: As tolerated with Full fall precautions use walker/cane & assistance as needed   Disposition SNF   Diet:   Heart Healthy with feeding assistance and aspiration precautions.  For Heart failure patients - Check your Weight same time everyday, if you gain over 2 pounds, or you develop in leg swelling, experience more shortness of breath or chest pain, call your Primary MD immediately. Follow Cardiac Low Salt Diet and 1.5 lit/day fluid restriction.   On your next visit with your primary care physician please Get Medicines reviewed and adjusted.   Please request your Prim.MD to go over all Hospital Tests and Procedure/Radiological results at the follow up, please get all Hospital records sent to your Prim MD by signing hospital release before you go home.   If you experience worsening of your admission symptoms, develop shortness of breath, life threatening emergency, suicidal or homicidal thoughts you must seek medical attention immediately by calling 911 or calling your MD immediately  if symptoms less severe.  You Must read complete instructions/literature along with all the possible adverse reactions/side effects for all the Medicines you take and that have been prescribed to you. Take any new Medicines after you have completely understood and accpet all the possible adverse reactions/side effects.   Do not drive, operating heavy machinery, perform activities at heights, swimming or participation in water activities or provide baby sitting services if your were admitted for syncope or siezures until you have seen by Primary MD or a Neurologist and advised to do so again.  Do not drive when taking Pain medications.    Do not take more than prescribed Pain, Sleep and Anxiety Medications  Special Instructions: If you have smoked or chewed Tobacco  in  the last 2 yrs please stop smoking, stop any regular Alcohol  and or any Recreational drug use.  Wear Seat belts while driving.   Please note  You were cared for by a hospitalist during your hospital stay. If you have any questions about your discharge medications or the care you received while you were in the hospital after you are discharged, you can call the unit and asked to speak with the hospitalist on call if the hospitalist that took care of you is not available. Once you are discharged, your primary care physician will handle any further medical issues. Please note that NO REFILLS for any discharge medications will be authorized once you are discharged, as it is imperative that you return to your primary care physician (or establish a relationship with a primary care physician if you do not have one) for your aftercare needs so that they can reassess your need for medications and monitor your lab values.

## 2015-09-18 NOTE — Discharge Summary (Signed)
Caroline Butler, is a 70 y.o. female  DOB 03/23/1946  MRN [id number].  Admission date:  09/13/2015  Admitting Physician  Maretta Bees, MD  Discharge Date:  09/18/2015   Primary MD  Jackie Plum, MD  Recommendations for primary care physician for things to follow:   Check CBC, BMP in 7-10 days.   Admission Diagnosis  Hypoglycemia [E16.2] Sprain of left knee, initial encounter [S83.92XA] Forehead abrasion, initial encounter [S00.81XA] Sprain of right knee, initial encounter [S83.91XA] Concussion, with loss of consciousness of 30 minutes or less, initial encounter [S06.0X1A]   Discharge Diagnosis  Hypoglycemia [E16.2] Sprain of left knee, initial encounter [S83.92XA] Forehead abrasion, initial encounter [S00.81XA] Sprain of right knee, initial encounter [S83.91XA] Concussion, with loss of consciousness of 30 minutes or less, initial encounter [S06.0X1A]    Principal Problem:   Fall Active Problems:   Syncope   UTI (lower urinary tract infection)   Dyslipidemia   Dementia      Past Medical History  Diagnosis Date  . Hypertension   . Dementia   . Gout     History reviewed. No pertinent past surgical history.     HPI  from the history and physical done on the day of admission:    Patient is a 70 y.o. female with medical history significant of Dementia, dyslipidemia who presented to the ED for evaluation of fall and left periorbital hematoma. Found to have a UTI and started on IV antibiotics. Seen by physical therapy, recommendations are for SNF. Social work consulted, tentative discharge to SNF early next week.   Hospital Course:     Fall: Uncertain whether this was a mechanical fall or a syncopal episode. Likely due to orthostatic vital signs, as this was positive. Patient was  hydrated IV fluids, orthostatic vital signs have resolved. Telemetry negative, echocardiogram with normal EF. Seen by physical therapy, plans are to discharge to SNF when bed available. Note, CT head and CT cervical spine negative. DC to SNF.  UTI: Afebrile, no leukocytosis. Continue Rocephin- stopped 5-15 .    Hypoglycemia: Had one occurrence of hypoglycemia in the emergency room on admission, none since then. A1c 5.7. Continue supportive care and proper diet.   Left eye periorbital hematoma: Likely secondary to fall. Swelling much improved with supportive care, CT of the orbits negative for fracture.  Dyslipidemia: Continue statin  History of gout: Continue allopurinol  Dementia with mild delirium: Supportive care. Suspect close to usual baseline.  Severe malnutrition: Continue supplements    Follow UP  Follow-up Information    Follow up with OSEI-BONSU,GEORGE, MD. Schedule an appointment as soon as possible for a visit in 1 week.   Specialty:  Internal Medicine   Contact information:   [address] [place] [address] [phone]        Consults obtained - None  Discharge Condition: Fair  Diet and Activity recommendation: See Discharge Instructions below  Discharge Instructions       Discharge Instructions    Diet - low sodium heart healthy  Complete by:  As directed      Discharge instructions    Complete by:  As directed   Follow with Primary MD OSEI-BONSU,GEORGE, MD in 7 days   Get CBC, CMP, 2 view Chest X ray checked  by Primary MD next visit.    Activity: As tolerated with Full fall precautions use walker/cane & assistance as needed   Disposition SNF   Diet:   Heart Healthy with feeding assistance and aspiration precautions.  For Heart failure patients - Check your Weight same time everyday, if you gain over 2 pounds, or you develop in leg swelling, experience more shortness of breath or chest pain, call your Primary MD immediately. Follow  Cardiac Low Salt Diet and 1.5 lit/day fluid restriction.   On your next visit with your primary care physician please Get Medicines reviewed and adjusted.   Please request your Prim.MD to go over all Hospital Tests and Procedure/Radiological results at the follow up, please get all Hospital records sent to your Prim MD by signing hospital release before you go home.   If you experience worsening of your admission symptoms, develop shortness of breath, life threatening emergency, suicidal or homicidal thoughts you must seek medical attention immediately by calling 911 or calling your MD immediately  if symptoms less severe.  You Must read complete instructions/literature along with all the possible adverse reactions/side effects for all the Medicines you take and that have been prescribed to you. Take any new Medicines after you have completely understood and accpet all the possible adverse reactions/side effects.   Do not drive, operating heavy machinery, perform activities at heights, swimming or participation in water activities or provide baby sitting services if your were admitted for syncope or siezures until you have seen by Primary MD or a Neurologist and advised to do so again.  Do not drive when taking Pain medications.    Do not take more than prescribed Pain, Sleep and Anxiety Medications  Special Instructions: If you have smoked or chewed Tobacco  in the last 2 yrs please stop smoking, stop any regular Alcohol  and or any Recreational drug use.  Wear Seat belts while driving.   Please note  You were cared for by a hospitalist during your hospital stay. If you have any questions about your discharge medications or the care you received while you were in the hospital after you are discharged, you can call the unit and asked to speak with the hospitalist on call if the hospitalist that took care of you is not available. Once you are discharged, your primary care physician will  handle any further medical issues. Please note that NO REFILLS for any discharge medications will be authorized once you are discharged, as it is imperative that you return to your primary care physician (or establish a relationship with a primary care physician if you do not have one) for your aftercare needs so that they can reassess your need for medications and monitor your lab values.     Increase activity slowly    Complete by:  As directed              Discharge Medications       Medication List    TAKE these medications        acetaminophen 325 MG tablet  Commonly known as:  TYLENOL  Take 2 tablets (650 mg total) by mouth every 6 (six) hours as needed for moderate pain or fever.     allopurinol 100  MG tablet  Commonly known as:  ZYLOPRIM  Take 100 mg by mouth daily.     alum & mag hydroxide-simeth 200-200-20 MG/5ML suspension  Commonly known as:  MAALOX/MYLANTA  Take 30 mLs by mouth every 6 (six) hours as needed for indigestion or heartburn.     aspirin 81 MG chewable tablet  Chew 81 mg by mouth daily.     atorvastatin 40 MG tablet  Commonly known as:  LIPITOR  Take 40 mg by mouth daily.     feeding supplement (ENSURE ENLIVE) Liqd  Take 237 mLs by mouth 2 (two) times daily between meals.     guaifenesin 100 MG/5ML syrup  Commonly known as:  ROBITUSSIN  Take 200 mg by mouth every 6 (six) hours as needed for cough.     loperamide 2 MG capsule  Commonly known as:  IMODIUM  Take 2-4 mg by mouth every 4 (four) hours as needed for diarrhea or loose stools (do not exceed 8 doses in 24 hours).     LORazepam 0.5 MG tablet  Commonly known as:  ATIVAN  Take 1 tablet (0.5 mg total) by mouth 2 (two) times daily as needed for anxiety.     magnesium hydroxide 400 MG/5ML suspension  Commonly known as:  MILK OF MAGNESIA  Take 30 mLs by mouth at bedtime as needed for mild constipation or moderate constipation.     neomycin-bacitracin-polymyxin Oint  Commonly known as:   NEOSPORIN  Apply 1 application topically daily as needed for wound care.        Major procedures and Radiology Reports - PLEASE review detailed and final reports for all details, in brief -       Ct Head Wo Contrast  09/13/2015  CLINICAL DATA:  70 year old female with unwitnessed fall. Large hematoma about the left eye. Unknown loss of consciousness. Initial encounter. EXAM: CT HEAD WITHOUT CONTRAST CT ORBITS WITHOUT CONTRAST CT CERVICAL SPINE WITHOUT CONTRAST TECHNIQUE: Multidetector CT imaging of the head, cervical spine, and orbital structures were performed using the standard protocol without intravenous contrast. Multiplanar CT image reconstructions of the cervical spine and maxillofacial structures were also generated. COMPARISON:  HEAD CT WITHOUT CONTRAST 12/07/2014. FINDINGS: CT HEAD FINDINGS Visualized paranasal sinuses and mastoids are clear. Large, 1.5 cm thick broad-based left frontal convexity scalp hematoma is present with a small volume of subcutaneous gas. Underlying frontal bones are intact. Left frontal sinus is clear. See also orbit findings below. Other scalp soft tissues are within normal limits. No calvarium fracture. Stable cerebral volume. No ventriculomegaly. No midline shift, mass effect, or evidence of intracranial mass lesion. No acute intracranial hemorrhage identified. Patchy and confluent white matter and deep gray matter hypodensity appears stable. No cortically based acute infarct identified. Dystrophic basal ganglia calcifications. No suspicious intracranial vascular hyperdensity. CT ORBITS FINDINGS Large broad-based left frontal region scalp hematoma tracks over the left orbit. The left globe remains intact. No intraorbital hematoma or contusion identified. Left orbital walls remain intact. Left zygoma intact. No nasal bone fracture. Visible left maxilla intact. Paranasal sinuses are clear aside from trace bilateral mucosal thickening. Negative right orbit soft tissues,  sequelae of bilateral cataract surgery incidentally noted. Negative visualized noncontrast deep soft tissue spaces of the face. Bilateral EAC debris. There is mild opacification of some inferior mastoid air cells. CT CERVICAL SPINE FINDINGS Visualized skull base is intact. No atlanto-occipital dissociation. Degenerative ligamentous hypertrophy about the odontoid. C1-C2 alignment within normal limits, in those levels appear intact. Cervicothoracic junction alignment is within  normal limits. Bilateral posterior element alignment is within normal limits. Moderate to severe chronic disc and endplate degeneration from C2-C3 to C6-C7. Multifactorial at least moderate degenerative spinal stenosis at C3-C4 (series 402, image 40). No cervical spine fracture identified. Grossly intact visualized upper thoracic levels. Negative lung apices. Calcified carotid bifurcation atherosclerosis. Otherwise negative noncontrast paraspinal soft tissues. IMPRESSION: 1. Left scalp soft tissue injury with large superficial scalp and periorbital hematoma. No intraorbital injury. No underlying fracture. 2. No acute traumatic injury to the brain. 3. No acute fracture or listhesis identified in the cervical spine. Ligamentous injury is not excluded. 4. Advanced cervical spine degeneration. At least moderate multifactorial degenerative spinal stenosis suspected at C3-C4. Electronically Signed   By: Odessa Fleming M.D.   On: 09/13/2015 15:10   Ct Cervical Spine Wo Contrast  09/13/2015  CLINICAL DATA:  70 year old female with unwitnessed fall. Large hematoma about the left eye. Unknown loss of consciousness. Initial encounter. EXAM: CT HEAD WITHOUT CONTRAST CT ORBITS WITHOUT CONTRAST CT CERVICAL SPINE WITHOUT CONTRAST TECHNIQUE: Multidetector CT imaging of the head, cervical spine, and orbital structures were performed using the standard protocol without intravenous contrast. Multiplanar CT image reconstructions of the cervical spine and maxillofacial  structures were also generated. COMPARISON:  HEAD CT WITHOUT CONTRAST 12/07/2014. FINDINGS: CT HEAD FINDINGS Visualized paranasal sinuses and mastoids are clear. Large, 1.5 cm thick broad-based left frontal convexity scalp hematoma is present with a small volume of subcutaneous gas. Underlying frontal bones are intact. Left frontal sinus is clear. See also orbit findings below. Other scalp soft tissues are within normal limits. No calvarium fracture. Stable cerebral volume. No ventriculomegaly. No midline shift, mass effect, or evidence of intracranial mass lesion. No acute intracranial hemorrhage identified. Patchy and confluent white matter and deep gray matter hypodensity appears stable. No cortically based acute infarct identified. Dystrophic basal ganglia calcifications. No suspicious intracranial vascular hyperdensity. CT ORBITS FINDINGS Large broad-based left frontal region scalp hematoma tracks over the left orbit. The left globe remains intact. No intraorbital hematoma or contusion identified. Left orbital walls remain intact. Left zygoma intact. No nasal bone fracture. Visible left maxilla intact. Paranasal sinuses are clear aside from trace bilateral mucosal thickening. Negative right orbit soft tissues, sequelae of bilateral cataract surgery incidentally noted. Negative visualized noncontrast deep soft tissue spaces of the face. Bilateral EAC debris. There is mild opacification of some inferior mastoid air cells. CT CERVICAL SPINE FINDINGS Visualized skull base is intact. No atlanto-occipital dissociation. Degenerative ligamentous hypertrophy about the odontoid. C1-C2 alignment within normal limits, in those levels appear intact. Cervicothoracic junction alignment is within normal limits. Bilateral posterior element alignment is within normal limits. Moderate to severe chronic disc and endplate degeneration from C2-C3 to C6-C7. Multifactorial at least moderate degenerative spinal stenosis at C3-C4  (series 402, image 40). No cervical spine fracture identified. Grossly intact visualized upper thoracic levels. Negative lung apices. Calcified carotid bifurcation atherosclerosis. Otherwise negative noncontrast paraspinal soft tissues. IMPRESSION: 1. Left scalp soft tissue injury with large superficial scalp and periorbital hematoma. No intraorbital injury. No underlying fracture. 2. No acute traumatic injury to the brain. 3. No acute fracture or listhesis identified in the cervical spine. Ligamentous injury is not excluded. 4. Advanced cervical spine degeneration. At least moderate multifactorial degenerative spinal stenosis suspected at C3-C4. Electronically Signed   By: Odessa Fleming M.D.   On: 09/13/2015 15:10   Dg Knee Complete 4 Views Left  09/13/2015  CLINICAL DATA:  Injury today, fell down, BILATERAL knee pain, week, history hypertension,  dementia EXAM: RIGHT KNEE - COMPLETE 4+ VIEW; LEFT KNEE - COMPLETE 4+ VIEW COMPARISON:  None FINDINGS: Osseous demineralization. Minimal joint space narrowing. No acute fracture, dislocation or bone destruction. Scattered atherosclerotic calcifications. No knee joint effusion. IMPRESSION: Minimal degenerative changes without acute bony abnormalities. Electronically Signed   By: Ulyses Southward M.D.   On: 09/13/2015 14:21   Dg Knee Complete 4 Views Right  09/13/2015  CLINICAL DATA:  Injury today, fell down, BILATERAL knee pain, week, history hypertension, dementia EXAM: RIGHT KNEE - COMPLETE 4+ VIEW; LEFT KNEE - COMPLETE 4+ VIEW COMPARISON:  None FINDINGS: Osseous demineralization. Minimal joint space narrowing. No acute fracture, dislocation or bone destruction. Scattered atherosclerotic calcifications. No knee joint effusion. IMPRESSION: Minimal degenerative changes without acute bony abnormalities. Electronically Signed   By: Ulyses Southward M.D.   On: 09/13/2015 14:21   Ct Orbitss W/o Cm  09/13/2015  CLINICAL DATA:  70 year old female with unwitnessed fall. Large hematoma  about the left eye. Unknown loss of consciousness. Initial encounter. EXAM: CT HEAD WITHOUT CONTRAST CT ORBITS WITHOUT CONTRAST CT CERVICAL SPINE WITHOUT CONTRAST TECHNIQUE: Multidetector CT imaging of the head, cervical spine, and orbital structures were performed using the standard protocol without intravenous contrast. Multiplanar CT image reconstructions of the cervical spine and maxillofacial structures were also generated. COMPARISON:  HEAD CT WITHOUT CONTRAST 12/07/2014. FINDINGS: CT HEAD FINDINGS Visualized paranasal sinuses and mastoids are clear. Large, 1.5 cm thick broad-based left frontal convexity scalp hematoma is present with a small volume of subcutaneous gas. Underlying frontal bones are intact. Left frontal sinus is clear. See also orbit findings below. Other scalp soft tissues are within normal limits. No calvarium fracture. Stable cerebral volume. No ventriculomegaly. No midline shift, mass effect, or evidence of intracranial mass lesion. No acute intracranial hemorrhage identified. Patchy and confluent white matter and deep gray matter hypodensity appears stable. No cortically based acute infarct identified. Dystrophic basal ganglia calcifications. No suspicious intracranial vascular hyperdensity. CT ORBITS FINDINGS Large broad-based left frontal region scalp hematoma tracks over the left orbit. The left globe remains intact. No intraorbital hematoma or contusion identified. Left orbital walls remain intact. Left zygoma intact. No nasal bone fracture. Visible left maxilla intact. Paranasal sinuses are clear aside from trace bilateral mucosal thickening. Negative right orbit soft tissues, sequelae of bilateral cataract surgery incidentally noted. Negative visualized noncontrast deep soft tissue spaces of the face. Bilateral EAC debris. There is mild opacification of some inferior mastoid air cells. CT CERVICAL SPINE FINDINGS Visualized skull base is intact. No atlanto-occipital dissociation.  Degenerative ligamentous hypertrophy about the odontoid. C1-C2 alignment within normal limits, in those levels appear intact. Cervicothoracic junction alignment is within normal limits. Bilateral posterior element alignment is within normal limits. Moderate to severe chronic disc and endplate degeneration from C2-C3 to C6-C7. Multifactorial at least moderate degenerative spinal stenosis at C3-C4 (series 402, image 40). No cervical spine fracture identified. Grossly intact visualized upper thoracic levels. Negative lung apices. Calcified carotid bifurcation atherosclerosis. Otherwise negative noncontrast paraspinal soft tissues. IMPRESSION: 1. Left scalp soft tissue injury with large superficial scalp and periorbital hematoma. No intraorbital injury. No underlying fracture. 2. No acute traumatic injury to the brain. 3. No acute fracture or listhesis identified in the cervical spine. Ligamentous injury is not excluded. 4. Advanced cervical spine degeneration. At least moderate multifactorial degenerative spinal stenosis suspected at C3-C4. Electronically Signed   By: Odessa Fleming M.D.   On: 09/13/2015 15:10    Micro Results      Recent Results (from  the past 240 hour(s))  Culture, Urine     Status: Abnormal   Collection Time: 09/13/15  3:19 PM  Result Value Ref Range Status   Specimen Description URINE, RANDOM  Final   Special Requests NONE  Final   Culture MULTIPLE SPECIES PRESENT, SUGGEST RECOLLECTION (A)  Final   Report Status 09/15/2015 FINAL  Final  MRSA PCR Screening     Status: None   Collection Time: 09/14/15  1:51 AM  Result Value Ref Range Status   MRSA by PCR NEGATIVE NEGATIVE Final    Comment:        The GeneXpert MRSA Assay (FDA approved for NASAL specimens only), is one component of a comprehensive MRSA colonization surveillance program. It is not intended to diagnose MRSA infection nor to guide or monitor treatment for MRSA infections.        Today   Subjective     Landyn Lorincz today has no headache,no chest abdominal pain,no new weakness tingling or numbness, feels much better wants to go home today.     Objective   Blood pressure 120/77, pulse 77, temperature 97.9 F (36.6 C), temperature source Oral, resp. rate 19, height 4\' 11"  (1.499 m), weight 37.875 kg (83 lb 8 oz), SpO2 99 %.   Intake/Output Summary (Last 24 hours) at 09/18/15 0853 Last data filed at 09/18/15 0844  Gross per 24 hour  Intake    455 ml  Output    300 ml  Net    155 ml    Exam Awake , Pleasantly confused, No new F.N deficits, Normal affect [place].AT,PERRAL Supple Neck,No JVD, No cervical lymphadenopathy appriciated.  Symmetrical Chest wall movement, Good air movement bilaterally, CTAB RRR,No Gallops,Rubs or new Murmurs, No Parasternal Heave +ve B.Sounds, Abd Soft, Non tender, No organomegaly appriciated, No rebound -guarding or rigidity. No Cyanosis, Clubbing or edema, No new Rash or bruise   Data Review   CBC w Diff: Lab Results  Component Value Date   WBC 3.0* 09/14/2015   HGB 12.0 09/14/2015   HCT 36.8 09/14/2015   PLT 173 09/14/2015   LYMPHOPCT 29 09/13/2015   MONOPCT 13 09/13/2015   EOSPCT 1 09/13/2015   BASOPCT 0 09/13/2015    CMP: Lab Results  Component Value Date   NA 141 09/14/2015   K 3.6 09/14/2015   CL 103 09/14/2015   CO2 29 09/14/2015   BUN 13 09/14/2015   CREATININE 1.11* 09/14/2015   PROT 8.7* 07/13/2015   ALBUMIN 4.7 07/13/2015   BILITOT 0.8 07/13/2015   ALKPHOS 98 07/13/2015   AST 56* 07/13/2015   ALT 36 07/13/2015  .   Total Time in preparing paper work, data evaluation and todays exam - 35 minutes  Leroy Sea M.D on 09/18/2015 at 8:53 AM  [facility]   Office  [phone]

## 2015-09-18 NOTE — Progress Notes (Signed)
Pt will be discharged to [facility] with POA as transportation.

## 2015-09-18 NOTE — Progress Notes (Signed)
Attempted to call report with no answer after 2 minutes.

## 2015-09-18 NOTE — Clinical Social Work Note (Signed)
CSW facilitated patient discharge including contacting patient POA and facility to confirm patient discharge plans. Clinical information faxed to facility and POA agreeable with plan. Patient's POA will transport patient today around 3:00 to [facility]. RN to call report prior to discharge [phone]).  CSW will sign off for now as social work intervention is no longer needed. Please consult us again if new needs arise.  Charlynn CourtSarah Mary-Anne Polizzi, CSW [phone]

## 2015-09-18 NOTE — Progress Notes (Signed)
Physical Therapy Treatment Patient Details Name: Caroline Butler MRN: [id number] DOB: 08-06-1945 Today's Date: 09/18/2015    History of Present Illness Caroline Butler is a 70 y.o. female with medical history significant of Dementia, dyslipidemia who presented to the ED for evaluation of fall and left periorbital hematoma.please note, patient has dementia and currently is very uncooperative and unreliable historian.There is no family at bedside, and most of this history is obtained after reviewing chart.Apparently, patient was found earlier today sitting on a chair in room at a memory care unit with a Abrasion and hematoma on her left eye.She was thought to be more confused than usual. It was not known whether she sustained a mechanical fall or actually had a syncopal episode.She was subsequently brought to the ED for further evaluation and treatment.At this time unable to obtain any further history.    PT Comments    Caroline Butler was very pleasantly confused and agreeable to therapy.  She denies dizziness while ambulating in hallway w/ hand held assist.  She requires physical assist and directional cues while ambulating.  She remains appropriate for SNF at d/c given pt's currently functional status.   Follow Up Recommendations  SNF;Supervision/Assistance - 24 hour     Equipment Recommendations  Other (comment) (TBA)    Recommendations for Other Services       Precautions / Restrictions Precautions Precautions: Fall Restrictions Weight Bearing Restrictions: No    Mobility  Bed Mobility Overal bed mobility: Needs Assistance Bed Mobility: Supine to Sit     Supine to sit: Supervision     General bed mobility comments: Multiple verbal cues to sit up as pt would forget what she was doing.  Superivision for safety w/ HOB elevated and pt using bed rail.  Transfers Overall transfer level: Needs assistance Equipment used: 1 person hand held assist Transfers: Sit to/from  Stand Sit to Stand: Min assist         General transfer comment: Pt slow to stand and 1 person HHA to steady.  Pt w/ flexed posture.  Ambulation/Gait Ambulation/Gait assistance: Min assist Ambulation Distance (Feet): 60 Feet Assistive device: 1 person hand held assist Gait Pattern/deviations: Step-through pattern;Decreased stride length;Trunk flexed   Gait velocity interpretation: <1.8 ft/sec, indicative of risk for recurrent falls General Gait Details: Flexed posture and HHA for support and to direct pt in hallway and around obstacles.  Pt stepping over dark tile as though afraid to step on it.     Stairs            Wheelchair Mobility    Modified Rankin (Stroke Patients Only)       Balance Overall balance assessment: Needs assistance Sitting-balance support: Single extremity supported;Feet supported Sitting balance-Leahy Scale: Fair Sitting balance - Comments: UE supported while sitting EOB   Standing balance support: Single extremity supported;During functional activity Standing balance-Leahy Scale: Poor Standing balance comment: Requires outside assist                    Cognition Arousal/Alertness: Awake/alert Behavior During Therapy: Flat affect Overall Cognitive Status: History of cognitive impairments - at baseline Area of Impairment: Memory;Orientation;Attention;Following commands;Safety/judgement;Awareness;Problem solving Orientation Level: Disoriented to;Place;Time;Situation Current Attention Level: Focused Memory: Decreased short-term memory Following Commands: Follows one step commands inconsistently Safety/Judgement: Decreased awareness of safety;Decreased awareness of deficits Awareness:  (pre intellectual) Problem Solving: Slow processing;Decreased initiation;Difficulty sequencing;Requires verbal cues;Requires tactile cues General Comments: Pt confused and unable to have meaningful conversation.  Repeating, "Caroline Butler", "Thank you ma'am",  "Alright"  Exercises General Exercises - Lower Extremity Ankle Circles/Pumps: Both;10 reps;AROM;Seated Long Arc Quad: AROM;Both;10 reps;Seated    General Comments        Pertinent Vitals/Pain Pain Assessment: Faces Faces Pain Scale: Hurts little more Pain Location: periorbital hematoma radiating down Lt side of face Pain Descriptors / Indicators: Grimacing;Discomfort Pain Intervention(s): Limited activity within patient's tolerance;Monitored during session;Repositioned    Home Living                      Prior Function            PT Goals (current goals can now be found in the care plan section) Acute Rehab PT Goals Patient Stated Goal: unable to assess PT Goal Formulation: Patient unable to participate in goal setting Time For Goal Achievement: 09/28/15 Potential to Achieve Goals: Good Progress towards PT goals: Progressing toward goals    Frequency  Min 3X/week    PT Plan Frequency needs to be updated    Co-evaluation             End of Session Equipment Utilized During Treatment: Gait belt Activity Tolerance: Patient limited by fatigue Patient left: in chair;with call bell/phone within reach;with chair alarm set     Time: 0920-0936 PT Time Calculation (min) (ACUTE ONLY): 16 min  Charges:  $Gait Training: 8-22 mins                    G Codes:      Caroline ChuAshley Butler PT, DPT  Pager: [phone] Phone: [phone] 09/18/2015, 9:54 AM

## 2015-09-18 NOTE — Clinical Social Work Placement (Signed)
   CLINICAL SOCIAL WORK PLACEMENT  NOTE  Date:  09/18/2015  Patient Details  Name: Caroline Butler MRN: [id number] Date of Birth: 1945/05/26  Clinical Social Work is seeking post-discharge placement for this patient at the Skilled  Nursing Facility level of care (*CSW will initial, date and re-position this form in  chart as items are completed):  Yes   Patient/family provided with [facility] Clinical Social Work Department's list of facilities offering this level of care within the geographic area requested by the patient (or if unable, by the patient's family).  Yes   Patient/family informed of their freedom to choose among providers that offer the needed level of care, that participate in Medicare, Medicaid or managed care program needed by the patient, have an available bed and are willing to accept the patient.  Yes   Patient/family informed of [facility]'s ownership interest in [facility] and [facility], as well as of the fact that they are under no obligation to receive care at these facilities.  PASRR submitted to EDS on       PASRR number received on 09/14/15     Existing PASRR number confirmed on       FL2 transmitted to all facilities in geographic area requested by pt/family on 09/14/15     FL2 transmitted to all facilities within larger geographic area on       Patient informed that his/her managed care company has contracts with or will negotiate with certain facilities, including the following:        Yes   Patient/family informed of bed offers received.  Patient chooses bed at [facility]     Physician recommends and patient chooses bed at      Patient to be transferred to [facility] on 09/18/15.  Patient to be transferred to facility by Girtha HakeWanda Gaddy, POA     Patient family notified on 09/18/15 of transfer.  Name of family member notified:  Girtha HakeWanda Gaddy     PHYSICIAN Please prepare prescriptions, Please sign FL2      Additional Comment:    _______________________________________________ Margarito LinerSarah C Diego Delancey, LCSW 09/18/2015, 10:08 AM

## 2015-09-18 NOTE — Clinical Social Work Note (Addendum)
CSW called Burna MortimerNikki Tsujii, admissions coordinator at [facility] to determine if she had made a decision on whether or not to accept patient. Left voicemail also letting her know that patient will be discharging today.  Charlynn CourtSarah Aniaya Bacha, CSW [phone]  Ms. Tsujii returned CSW's phone call and has extended a bed offer to patient. CSW contact patient's POA and notified her that patient would be discharging today. Patient's POA accepted bed offer at [facility]. Patient's POA will be able to transport her before 3:00 today.  Charlynn CourtSarah Ildefonso Keaney, CSW [phone]

## 2015-09-19 ENCOUNTER — Encounter: Payer: Self-pay | Admitting: Internal Medicine

## 2015-09-19 ENCOUNTER — Non-Acute Institutional Stay (SKILLED_NURSING_FACILITY): Payer: Medicare Other | Admitting: Internal Medicine

## 2015-09-19 DIAGNOSIS — H05232 Hemorrhage of left orbit: Secondary | ICD-10-CM | POA: Diagnosis not present

## 2015-09-19 DIAGNOSIS — M1A00X0 Idiopathic chronic gout, unspecified site, without tophus (tophi): Secondary | ICD-10-CM

## 2015-09-19 DIAGNOSIS — E785 Hyperlipidemia, unspecified: Secondary | ICD-10-CM

## 2015-09-19 DIAGNOSIS — W19XXXD Unspecified fall, subsequent encounter: Secondary | ICD-10-CM | POA: Diagnosis not present

## 2015-09-19 DIAGNOSIS — F0390 Unspecified dementia without behavioral disturbance: Secondary | ICD-10-CM

## 2015-09-19 DIAGNOSIS — N390 Urinary tract infection, site not specified: Secondary | ICD-10-CM | POA: Diagnosis not present

## 2015-09-19 DIAGNOSIS — E46 Unspecified protein-calorie malnutrition: Secondary | ICD-10-CM

## 2015-09-19 DIAGNOSIS — E162 Hypoglycemia, unspecified: Secondary | ICD-10-CM | POA: Diagnosis not present

## 2015-09-19 NOTE — Progress Notes (Signed)
MRN: [id number] Name: Caroline Butler  Sex: female Age: 70 y.o. DOB: 08-16-1945  PSC #: [facility] Facility/Room:506-P Level Of Care: SNF Provider: Randon Goldsmith. Lyn Hollingshead, MD Emergency Contacts: Extended Emergency Contact Information Primary Emergency Contact: Gaddy,Wanda Address: [address]          [place] [address] [place] Home Phone: [phone] Work Phone: [phone] Mobile Phone: [phone] Relation: None  Code Status: Full Code  Allergies: Review of patient's allergies indicates no known allergies.  Chief Complaint  Patient presents with  . New Admit To SNF    Admission to Facility    HPI: Patient is 70 y.o. female with Dementia, dyslipidemia who presented to the ED for evaluation of fall and left periorbital hematoma. Pt was admitted to [facility] from 5/10-15 where she was  found to have a UTI and started on IV antibiotics. Work up for head trauma was neg for acute injuries except a black eye.Pt is admitted to SNF for generalized weakness for OT/PT. While at SNF pt will be followed for HLD, tx with lipitor, gout, tx with allopurinol and dementia, tx with supportive care.  Past Medical History  Diagnosis Date  . Hypertension   . Dementia   . Gout     No past surgical history on file.    Medication List       This list is accurate as of: 09/19/15 11:59 PM.  Always use your most recent med list.               acetaminophen 325 MG tablet  Commonly known as:  TYLENOL  Take 2 tablets (650 mg total) by mouth every 6 (six) hours as needed for moderate pain or fever.     allopurinol 100 MG tablet  Commonly known as:  ZYLOPRIM  Take 100 mg by mouth daily.     alum & mag hydroxide-simeth 200-200-20 MG/5ML suspension  Commonly known as:  MAALOX/MYLANTA  Take 30 mLs by mouth every 6 (six) hours as needed for indigestion or heartburn.     aspirin 81 MG chewable tablet  Chew 81 mg by mouth daily.     atorvastatin 40 MG tablet  Commonly  known as:  LIPITOR  Take 40 mg by mouth daily.     feeding supplement (ENSURE ENLIVE) Liqd  Take 237 mLs by mouth 2 (two) times daily between meals.     guaifenesin 100 MG/5ML syrup  Commonly known as:  ROBITUSSIN  Take 200 mg by mouth every 6 (six) hours as needed for cough.     loperamide 2 MG capsule  Commonly known as:  IMODIUM  Take 2-4 mg by mouth every 4 (four) hours as needed for diarrhea or loose stools (do not exceed 8 doses in 24 hours).     LORazepam 0.5 MG tablet  Commonly known as:  ATIVAN  Take 1 tablet (0.5 mg total) by mouth 2 (two) times daily as needed for anxiety.     magnesium hydroxide 400 MG/5ML suspension  Commonly known as:  MILK OF MAGNESIA  Take 30 mLs by mouth at bedtime as needed for mild constipation or moderate constipation.     neomycin-bacitracin-polymyxin Oint  Commonly known as:  NEOSPORIN  Apply 1 application topically daily as needed for wound care.        No orders of the defined types were placed in this encounter.     There is no immunization history on file for this patient.  Social History  Substance Use Topics  .  Smoking status: Never Smoker   . Smokeless tobacco: Not on file  . Alcohol Use: No    Family history is UTO 2/2 pt dementia.  No family history on file.    Review of Systems UTO 2/2 dementia     Filed Vitals:   09/19/15 0951  BP: 108/62  Pulse: 111  Temp: 98.5 F (36.9 C)    SpO2 Readings from Last 1 Encounters:  09/18/15 99%        Physical Exam  GENERAL APPEARANCE: Alert, min conversant,  No acute distress.  SKIN: No diaphoresis rash; healing bruise L periorbital skin, mild swelling HEAD: Normocephalic, atraumatic  EYES: Conjunctiva/lids clear. Pupils round, reactive. EOMs intact.  EARS: External exam WNL, canals clear. Hearing grossly normal.  NOSE: No deformity or discharge.  MOUTH/THROAT: Lips w/o lesions  RESPIRATORY: Breathing is even, unlabored. Lung sounds are clear    CARDIOVASCULAR: Heart RRR no murmurs, rubs or gallops. No peripheral edema.   GASTROINTESTINAL: Abdomen is soft, non-tender, not distended w/ normal bowel sounds. GENITOURINARY: Bladder non tender, not distended  MUSCULOSKELETAL: No abnormal joints or musculature NEUROLOGIC:  Cranial nerves 2-12 grossly intact. Moves all extremities  PSYCHIATRIC: dementia, no behavioral issues  Patient Active Problem List   Diagnosis Date Noted  . Hypoglycemia 09/24/2015  . Periorbital hematoma of left eye 09/24/2015  . Gout 09/24/2015  . Protein-calorie malnutrition (HCC) 09/24/2015  . Fall 09/13/2015  . Syncope 09/13/2015  . UTI (lower urinary tract infection) 09/13/2015  . Dyslipidemia 09/13/2015  . Dementia 09/13/2015       Component Value Date/Time   WBC 3.0* 09/14/2015 0459   RBC 3.84* 09/14/2015 0459   HGB 12.0 09/14/2015 0459   HCT 36.8 09/14/2015 0459   PLT 173 09/14/2015 0459   MCV 95.8 09/14/2015 0459   LYMPHSABS 1.3 09/13/2015 1627   MONOABS 0.6 09/13/2015 1627   EOSABS 0.1 09/13/2015 1627   BASOSABS 0.0 09/13/2015 1627        Component Value Date/Time   NA 141 09/14/2015 0459   K 3.6 09/14/2015 0459   CL 103 09/14/2015 0459   CO2 29 09/14/2015 0459   GLUCOSE 78 09/14/2015 0459   BUN 13 09/14/2015 0459   CREATININE 1.11* 09/14/2015 0459   CALCIUM 9.3 09/14/2015 0459   PROT 8.7* 07/13/2015 1638   ALBUMIN 4.7 07/13/2015 1638   AST 56* 07/13/2015 1638   ALT 36 07/13/2015 1638   ALKPHOS 98 07/13/2015 1638   BILITOT 0.8 07/13/2015 1638   GFRNONAA 49* 09/14/2015 0459   GFRAA 57* 09/14/2015 0459    Lab Results  Component Value Date   HGBA1C 5.7* 09/13/2015    No results found for: CHOL, HDL, LDLCALC, LDLDIRECT, TRIG, CHOLHDL   Ct Head Wo Contrast  09/13/2015  CLINICAL DATA:  70 year old female with unwitnessed fall. Large hematoma about the left eye. Unknown loss of consciousness. Initial encounter. EXAM: CT HEAD WITHOUT CONTRAST CT ORBITS WITHOUT CONTRAST CT  CERVICAL SPINE WITHOUT CONTRAST TECHNIQUE: Multidetector CT imaging of the head, cervical spine, and orbital structures were performed using the standard protocol without intravenous contrast. Multiplanar CT image reconstructions of the cervical spine and maxillofacial structures were also generated. COMPARISON:  HEAD CT WITHOUT CONTRAST 12/07/2014. FINDINGS: CT HEAD FINDINGS Visualized paranasal sinuses and mastoids are clear. Large, 1.5 cm thick broad-based left frontal convexity scalp hematoma is present with a small volume of subcutaneous gas. Underlying frontal bones are intact. Left frontal sinus is clear. See also orbit findings below. Other scalp soft tissues are  within normal limits. No calvarium fracture. Stable cerebral volume. No ventriculomegaly. No midline shift, mass effect, or evidence of intracranial mass lesion. No acute intracranial hemorrhage identified. Patchy and confluent white matter and deep gray matter hypodensity appears stable. No cortically based acute infarct identified. Dystrophic basal ganglia calcifications. No suspicious intracranial vascular hyperdensity. CT ORBITS FINDINGS Large broad-based left frontal region scalp hematoma tracks over the left orbit. The left globe remains intact. No intraorbital hematoma or contusion identified. Left orbital walls remain intact. Left zygoma intact. No nasal bone fracture. Visible left maxilla intact. Paranasal sinuses are clear aside from trace bilateral mucosal thickening. Negative right orbit soft tissues, sequelae of bilateral cataract surgery incidentally noted. Negative visualized noncontrast deep soft tissue spaces of the face. Bilateral EAC debris. There is mild opacification of some inferior mastoid air cells. CT CERVICAL SPINE FINDINGS Visualized skull base is intact. No atlanto-occipital dissociation. Degenerative ligamentous hypertrophy about the odontoid. C1-C2 alignment within normal limits, in those levels appear intact.  Cervicothoracic junction alignment is within normal limits. Bilateral posterior element alignment is within normal limits. Moderate to severe chronic disc and endplate degeneration from C2-C3 to C6-C7. Multifactorial at least moderate degenerative spinal stenosis at C3-C4 (series 402, image 40). No cervical spine fracture identified. Grossly intact visualized upper thoracic levels. Negative lung apices. Calcified carotid bifurcation atherosclerosis. Otherwise negative noncontrast paraspinal soft tissues. IMPRESSION: 1. Left scalp soft tissue injury with large superficial scalp and periorbital hematoma. No intraorbital injury. No underlying fracture. 2. No acute traumatic injury to the brain. 3. No acute fracture or listhesis identified in the cervical spine. Ligamentous injury is not excluded. 4. Advanced cervical spine degeneration. At least moderate multifactorial degenerative spinal stenosis suspected at C3-C4. Electronically Signed   By: Odessa FlemingH  Hall M.D.   On: 09/13/2015 15:10   Ct Cervical Spine Wo Contrast  09/13/2015  CLINICAL DATA:  70 year old female with unwitnessed fall. Large hematoma about the left eye. Unknown loss of consciousness. Initial encounter. EXAM: CT HEAD WITHOUT CONTRAST CT ORBITS WITHOUT CONTRAST CT CERVICAL SPINE WITHOUT CONTRAST TECHNIQUE: Multidetector CT imaging of the head, cervical spine, and orbital structures were performed using the standard protocol without intravenous contrast. Multiplanar CT image reconstructions of the cervical spine and maxillofacial structures were also generated. COMPARISON:  HEAD CT WITHOUT CONTRAST 12/07/2014. FINDINGS: CT HEAD FINDINGS Visualized paranasal sinuses and mastoids are clear. Large, 1.5 cm thick broad-based left frontal convexity scalp hematoma is present with a small volume of subcutaneous gas. Underlying frontal bones are intact. Left frontal sinus is clear. See also orbit findings below. Other scalp soft tissues are within normal limits. No  calvarium fracture. Stable cerebral volume. No ventriculomegaly. No midline shift, mass effect, or evidence of intracranial mass lesion. No acute intracranial hemorrhage identified. Patchy and confluent white matter and deep gray matter hypodensity appears stable. No cortically based acute infarct identified. Dystrophic basal ganglia calcifications. No suspicious intracranial vascular hyperdensity. CT ORBITS FINDINGS Large broad-based left frontal region scalp hematoma tracks over the left orbit. The left globe remains intact. No intraorbital hematoma or contusion identified. Left orbital walls remain intact. Left zygoma intact. No nasal bone fracture. Visible left maxilla intact. Paranasal sinuses are clear aside from trace bilateral mucosal thickening. Negative right orbit soft tissues, sequelae of bilateral cataract surgery incidentally noted. Negative visualized noncontrast deep soft tissue spaces of the face. Bilateral EAC debris. There is mild opacification of some inferior mastoid air cells. CT CERVICAL SPINE FINDINGS Visualized skull base is intact. No atlanto-occipital dissociation. Degenerative ligamentous hypertrophy  about the odontoid. C1-C2 alignment within normal limits, in those levels appear intact. Cervicothoracic junction alignment is within normal limits. Bilateral posterior element alignment is within normal limits. Moderate to severe chronic disc and endplate degeneration from C2-C3 to C6-C7. Multifactorial at least moderate degenerative spinal stenosis at C3-C4 (series 402, image 40). No cervical spine fracture identified. Grossly intact visualized upper thoracic levels. Negative lung apices. Calcified carotid bifurcation atherosclerosis. Otherwise negative noncontrast paraspinal soft tissues. IMPRESSION: 1. Left scalp soft tissue injury with large superficial scalp and periorbital hematoma. No intraorbital injury. No underlying fracture. 2. No acute traumatic injury to the brain. 3. No acute  fracture or listhesis identified in the cervical spine. Ligamentous injury is not excluded. 4. Advanced cervical spine degeneration. At least moderate multifactorial degenerative spinal stenosis suspected at C3-C4. Electronically Signed   By: Odessa Fleming M.D.   On: 09/13/2015 15:10   Dg Knee Complete 4 Views Left  09/13/2015  CLINICAL DATA:  Injury today, fell down, BILATERAL knee pain, week, history hypertension, dementia EXAM: RIGHT KNEE - COMPLETE 4+ VIEW; LEFT KNEE - COMPLETE 4+ VIEW COMPARISON:  None FINDINGS: Osseous demineralization. Minimal joint space narrowing. No acute fracture, dislocation or bone destruction. Scattered atherosclerotic calcifications. No knee joint effusion. IMPRESSION: Minimal degenerative changes without acute bony abnormalities. Electronically Signed   By: Ulyses Southward M.D.   On: 09/13/2015 14:21   Dg Knee Complete 4 Views Right  09/13/2015  CLINICAL DATA:  Injury today, fell down, BILATERAL knee pain, week, history hypertension, dementia EXAM: RIGHT KNEE - COMPLETE 4+ VIEW; LEFT KNEE - COMPLETE 4+ VIEW COMPARISON:  None FINDINGS: Osseous demineralization. Minimal joint space narrowing. No acute fracture, dislocation or bone destruction. Scattered atherosclerotic calcifications. No knee joint effusion. IMPRESSION: Minimal degenerative changes without acute bony abnormalities. Electronically Signed   By: Ulyses Southward M.D.   On: 09/13/2015 14:21   Ct Orbitss W/o Cm  09/13/2015  CLINICAL DATA:  70 year old female with unwitnessed fall. Large hematoma about the left eye. Unknown loss of consciousness. Initial encounter. EXAM: CT HEAD WITHOUT CONTRAST CT ORBITS WITHOUT CONTRAST CT CERVICAL SPINE WITHOUT CONTRAST TECHNIQUE: Multidetector CT imaging of the head, cervical spine, and orbital structures were performed using the standard protocol without intravenous contrast. Multiplanar CT image reconstructions of the cervical spine and maxillofacial structures were also generated.  COMPARISON:  HEAD CT WITHOUT CONTRAST 12/07/2014. FINDINGS: CT HEAD FINDINGS Visualized paranasal sinuses and mastoids are clear. Large, 1.5 cm thick broad-based left frontal convexity scalp hematoma is present with a small volume of subcutaneous gas. Underlying frontal bones are intact. Left frontal sinus is clear. See also orbit findings below. Other scalp soft tissues are within normal limits. No calvarium fracture. Stable cerebral volume. No ventriculomegaly. No midline shift, mass effect, or evidence of intracranial mass lesion. No acute intracranial hemorrhage identified. Patchy and confluent white matter and deep gray matter hypodensity appears stable. No cortically based acute infarct identified. Dystrophic basal ganglia calcifications. No suspicious intracranial vascular hyperdensity. CT ORBITS FINDINGS Large broad-based left frontal region scalp hematoma tracks over the left orbit. The left globe remains intact. No intraorbital hematoma or contusion identified. Left orbital walls remain intact. Left zygoma intact. No nasal bone fracture. Visible left maxilla intact. Paranasal sinuses are clear aside from trace bilateral mucosal thickening. Negative right orbit soft tissues, sequelae of bilateral cataract surgery incidentally noted. Negative visualized noncontrast deep soft tissue spaces of the face. Bilateral EAC debris. There is mild opacification of some inferior mastoid air cells. CT CERVICAL SPINE FINDINGS  Visualized skull base is intact. No atlanto-occipital dissociation. Degenerative ligamentous hypertrophy about the odontoid. C1-C2 alignment within normal limits, in those levels appear intact. Cervicothoracic junction alignment is within normal limits. Bilateral posterior element alignment is within normal limits. Moderate to severe chronic disc and endplate degeneration from C2-C3 to C6-C7. Multifactorial at least moderate degenerative spinal stenosis at C3-C4 (series 402, image 40). No cervical  spine fracture identified. Grossly intact visualized upper thoracic levels. Negative lung apices. Calcified carotid bifurcation atherosclerosis. Otherwise negative noncontrast paraspinal soft tissues. IMPRESSION: 1. Left scalp soft tissue injury with large superficial scalp and periorbital hematoma. No intraorbital injury. No underlying fracture. 2. No acute traumatic injury to the brain. 3. No acute fracture or listhesis identified in the cervical spine. Ligamentous injury is not excluded. 4. Advanced cervical spine degeneration. At least moderate multifactorial degenerative spinal stenosis suspected at C3-C4. Electronically Signed   By: Odessa Fleming M.D.   On: 09/13/2015 15:10    Not all labs, radiology exams or other studies done during hospitalization come through on my EPIC note; however they are reviewed by me.    Assessment and Plan  Fall Uncertain whether this was a mechanical fall or a syncopal episode. Likely due to orthostatic vital signs, as this was positive. Patient was hydrated IV fluids, orthostatic vital signs have resolved. Telemetry negative, echocardiogram with normal EF. Seen by physical therapy, plans are to discharge to SNF when bed available. Note, CT head and CT cervical spine negative.  SNF - admit for Ot/PT  UTI (lower urinary tract infection) Afebrile, no leukocytosis. Continue Rocephin- stopped 5-15 . SNF - will monitor  Hypoglycemia Had one occurrence of hypoglycemia in the emergency room on admission, none since then. A1c 5.7. SNF -  supportive care and proper diet.   Periorbital hematoma of left eye SNF - Likely secondary to fall. Swelling much improved with supportive care, CT of the orbits negative for fracture.  Dementia SNF - considered  close to baseline; will monitor  Dyslipidemia SNF - not stated as uncontrolled; cont lipitor 40 mg daily  Gout SNF - no recent flares; cont allopurinal  Protein-calorie malnutrition (HCC) SNF - cont ensure  supplement   Time spent > 45 min;> 50% of time with patient was spent reviewing records, labs, tests and studies, counseling and developing plan of care  Thurston Hole D. Lyn Hollingshead,  MD

## 2015-09-24 ENCOUNTER — Encounter: Payer: Self-pay | Admitting: Internal Medicine

## 2015-09-24 DIAGNOSIS — E46 Unspecified protein-calorie malnutrition: Secondary | ICD-10-CM | POA: Insufficient documentation

## 2015-09-24 DIAGNOSIS — E162 Hypoglycemia, unspecified: Secondary | ICD-10-CM | POA: Insufficient documentation

## 2015-09-24 DIAGNOSIS — H05232 Hemorrhage of left orbit: Secondary | ICD-10-CM | POA: Insufficient documentation

## 2015-09-24 DIAGNOSIS — M109 Gout, unspecified: Secondary | ICD-10-CM | POA: Insufficient documentation

## 2015-09-24 NOTE — Assessment & Plan Note (Signed)
Uncertain whether this was a mechanical fall or a syncopal episode. Likely due to orthostatic vital signs, as this was positive. Patient was hydrated IV fluids, orthostatic vital signs have resolved. Telemetry negative, echocardiogram with normal EF. Seen by physical therapy, plans are to discharge to SNF when bed available. Note, CT head and CT cervical spine negative.  SNF - admit for Ot/PT

## 2015-09-24 NOTE — Assessment & Plan Note (Signed)
Afebrile, no leukocytosis. Continue Rocephin- stopped 5-15 . SNF - will monitor

## 2015-09-24 NOTE — Assessment & Plan Note (Signed)
SNF - considered  close to baseline; will monitor

## 2015-09-24 NOTE — Assessment & Plan Note (Signed)
SNF - no recent flares; cont allopurinal

## 2015-09-24 NOTE — Assessment & Plan Note (Signed)
SNF - not stated as uncontrolled;cont lipitor 40 mg daily 

## 2015-09-24 NOTE — Assessment & Plan Note (Signed)
SNF - cont ensure supplement

## 2015-09-24 NOTE — Assessment & Plan Note (Signed)
SNF - Likely secondary to fall. Swelling much improved with supportive care, CT of the orbits negative for fracture.

## 2015-09-24 NOTE — Assessment & Plan Note (Signed)
Had one occurrence of hypoglycemia in the emergency room on admission, none since then. A1c 5.7. SNF -  supportive care and proper diet.

## 2015-09-29 ENCOUNTER — Encounter: Payer: Self-pay | Admitting: Internal Medicine

## 2015-09-29 ENCOUNTER — Non-Acute Institutional Stay (SKILLED_NURSING_FACILITY): Payer: Medicare Other | Admitting: Internal Medicine

## 2015-09-29 DIAGNOSIS — E785 Hyperlipidemia, unspecified: Secondary | ICD-10-CM

## 2015-09-29 DIAGNOSIS — M1A00X0 Idiopathic chronic gout, unspecified site, without tophus (tophi): Secondary | ICD-10-CM

## 2015-09-29 DIAGNOSIS — N390 Urinary tract infection, site not specified: Secondary | ICD-10-CM

## 2015-09-29 DIAGNOSIS — H05232 Hemorrhage of left orbit: Secondary | ICD-10-CM

## 2015-09-29 DIAGNOSIS — E46 Unspecified protein-calorie malnutrition: Secondary | ICD-10-CM

## 2015-09-29 DIAGNOSIS — F0390 Unspecified dementia without behavioral disturbance: Secondary | ICD-10-CM | POA: Diagnosis not present

## 2015-09-29 DIAGNOSIS — W19XXXD Unspecified fall, subsequent encounter: Secondary | ICD-10-CM | POA: Diagnosis not present

## 2015-09-29 NOTE — Progress Notes (Signed)
MRN: [id number] Name: Caroline Butler  Sex: female Age: 70 y.o. DOB: 08-04-1945  PSC #:  Facility/Room: Level Of Care: SNF Provider: Merrilee Seashore D Emergency Contacts: Extended Emergency Contact Information Primary Emergency Contact: Gaddy,Wanda Address: [address]          [place] [address] [place] Home Phone: [phone] Work Phone: [phone] Mobile Phone: [phone] Relation: None  Code Status:   Allergies: Review of patient's allergies indicates no known allergies.  Chief Complaint  Patient presents with  . Discharge Note    HPI: Patient is 70 y.o. female with Dementia, dyslipidemia who presented to the ED for evaluation of fall and left periorbital hematoma. Pt was admitted to [facility] from 5/10-15 where she was found to have a UTI and started on IV antibiotics. Work up for head trauma was neg for acute injuries except a black eye.Pt is admitted to SNF for generalized weakness for OT/PT. Pt is now ready to be d/c to home.  Past Medical History  Diagnosis Date  . Hypertension   . Dementia   . Gout     No past surgical history on file.    Medication List       This list is accurate as of: 09/29/15  1:28 PM.  Always use your most recent med list.               acetaminophen 325 MG tablet  Commonly known as:  TYLENOL  Take 2 tablets (650 mg total) by mouth every 6 (six) hours as needed for moderate pain or fever.     allopurinol 100 MG tablet  Commonly known as:  ZYLOPRIM  Take 100 mg by mouth daily.     alum & mag hydroxide-simeth 200-200-20 MG/5ML suspension  Commonly known as:  MAALOX/MYLANTA  Take 30 mLs by mouth every 6 (six) hours as needed for indigestion or heartburn.     aspirin 81 MG chewable tablet  Chew 81 mg by mouth daily.     atorvastatin 40 MG tablet  Commonly known as:  LIPITOR  Take 40 mg by mouth daily.     feeding supplement (ENSURE ENLIVE) Liqd  Take 237 mLs by mouth 2 (two) times daily between  meals.     guaifenesin 100 MG/5ML syrup  Commonly known as:  ROBITUSSIN  Take 200 mg by mouth every 6 (six) hours as needed for cough.     loperamide 2 MG capsule  Commonly known as:  IMODIUM  Take 2-4 mg by mouth every 4 (four) hours as needed for diarrhea or loose stools (do not exceed 8 doses in 24 hours).     LORazepam 0.5 MG tablet  Commonly known as:  ATIVAN  Take 1 tablet (0.5 mg total) by mouth 2 (two) times daily as needed for anxiety.     magnesium hydroxide 400 MG/5ML suspension  Commonly known as:  MILK OF MAGNESIA  Take 30 mLs by mouth at bedtime as needed for mild constipation or moderate constipation.     neomycin-bacitracin-polymyxin Oint  Commonly known as:  NEOSPORIN  Apply 1 application topically daily as needed for wound care.        No orders of the defined types were placed in this encounter.     There is no immunization history on file for this patient.  Social History  Substance Use Topics  . Smoking status: Never Smoker   . Smokeless tobacco: Not on file  . Alcohol Use: No    Filed Vitals:  09/29/15 1104  BP: 131/76  Pulse: 93  Temp: 97.8 F (36.6 C)  Resp: 20    Physical Exam  GENERAL APPEARANCE: Alert, conversant. No acute distress.  HEENT: Unremarkable. RESPIRATORY: Breathing is even, unlabored. Lung sounds are clear   CARDIOVASCULAR: Heart RRR no murmurs, rubs or gallops. No peripheral edema.  GASTROINTESTINAL: Abdomen is soft, non-tender, not distended w/ normal bowel sounds.  NEUROLOGIC: Cranial nerves 2-12 grossly intact. Moves all extremities  Patient Active Problem List   Diagnosis Date Noted  . Hypoglycemia 09/24/2015  . Periorbital hematoma of left eye 09/24/2015  . Gout 09/24/2015  . Protein-calorie malnutrition (HCC) 09/24/2015  . Fall 09/13/2015  . Syncope 09/13/2015  . UTI (lower urinary tract infection) 09/13/2015  . Dyslipidemia 09/13/2015  . Dementia 09/13/2015    CBC    Component Value Date/Time   WBC  3.0* 09/14/2015 0459   RBC 3.84* 09/14/2015 0459   HGB 12.0 09/14/2015 0459   HCT 36.8 09/14/2015 0459   PLT 173 09/14/2015 0459   MCV 95.8 09/14/2015 0459   LYMPHSABS 1.3 09/13/2015 1627   MONOABS 0.6 09/13/2015 1627   EOSABS 0.1 09/13/2015 1627   BASOSABS 0.0 09/13/2015 1627    CMP     Component Value Date/Time   NA 141 09/14/2015 0459   K 3.6 09/14/2015 0459   CL 103 09/14/2015 0459   CO2 29 09/14/2015 0459   GLUCOSE 78 09/14/2015 0459   BUN 13 09/14/2015 0459   CREATININE 1.11* 09/14/2015 0459   CALCIUM 9.3 09/14/2015 0459   PROT 8.7* 07/13/2015 1638   ALBUMIN 4.7 07/13/2015 1638   AST 56* 07/13/2015 1638   ALT 36 07/13/2015 1638   ALKPHOS 98 07/13/2015 1638   BILITOT 0.8 07/13/2015 1638   GFRNONAA 49* 09/14/2015 0459   GFRAA 57* 09/14/2015 0459    Assessment and Plan  Pt is d/c to ALF Memory care with HH/OT/PT. Medications have been reconciled and rx's written.   Time spent > 30 min;> 50% of time with patient was spent reviewing records, labs, tests and studies, counseling and developing plan of care  Margit HanksALEXANDER, Laquesha Holcomb D, MD

## 2017-08-01 IMAGING — DX DG KNEE COMPLETE 4+V*L*
4 series · 4 of 4 positions shown · non-contrast
Comparison: None

CLINICAL DATA: Injury today, fell down, BILATERAL knee pain, week,
history hypertension, dementia

EXAM:
RIGHT KNEE - COMPLETE 4+ VIEW; LEFT KNEE - COMPLETE 4+ VIEW

[t knee ap left]
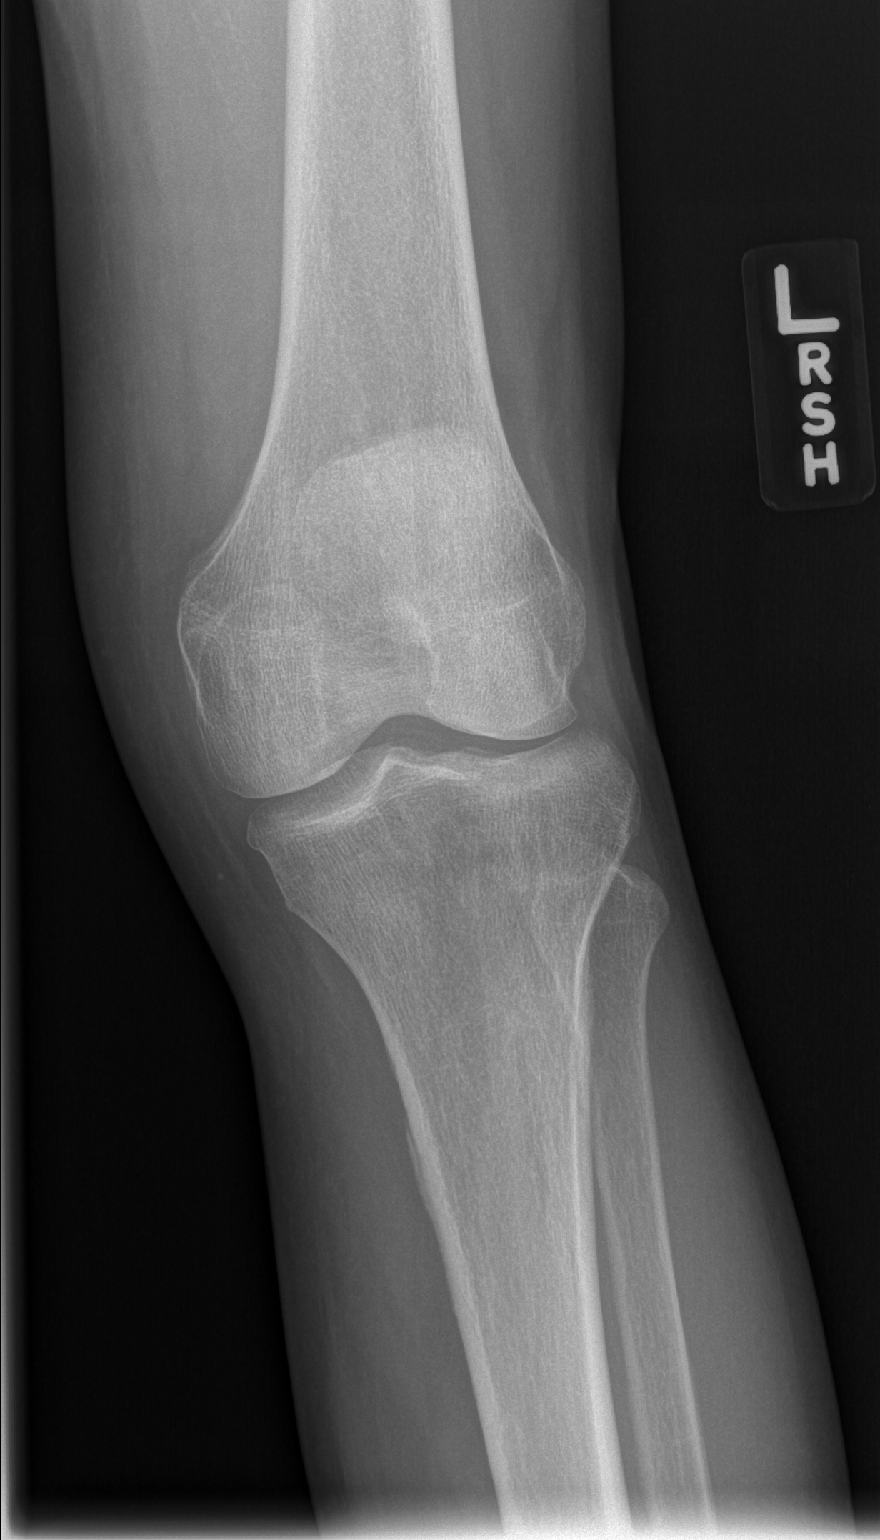

[t knee obl left (1 of 2)]
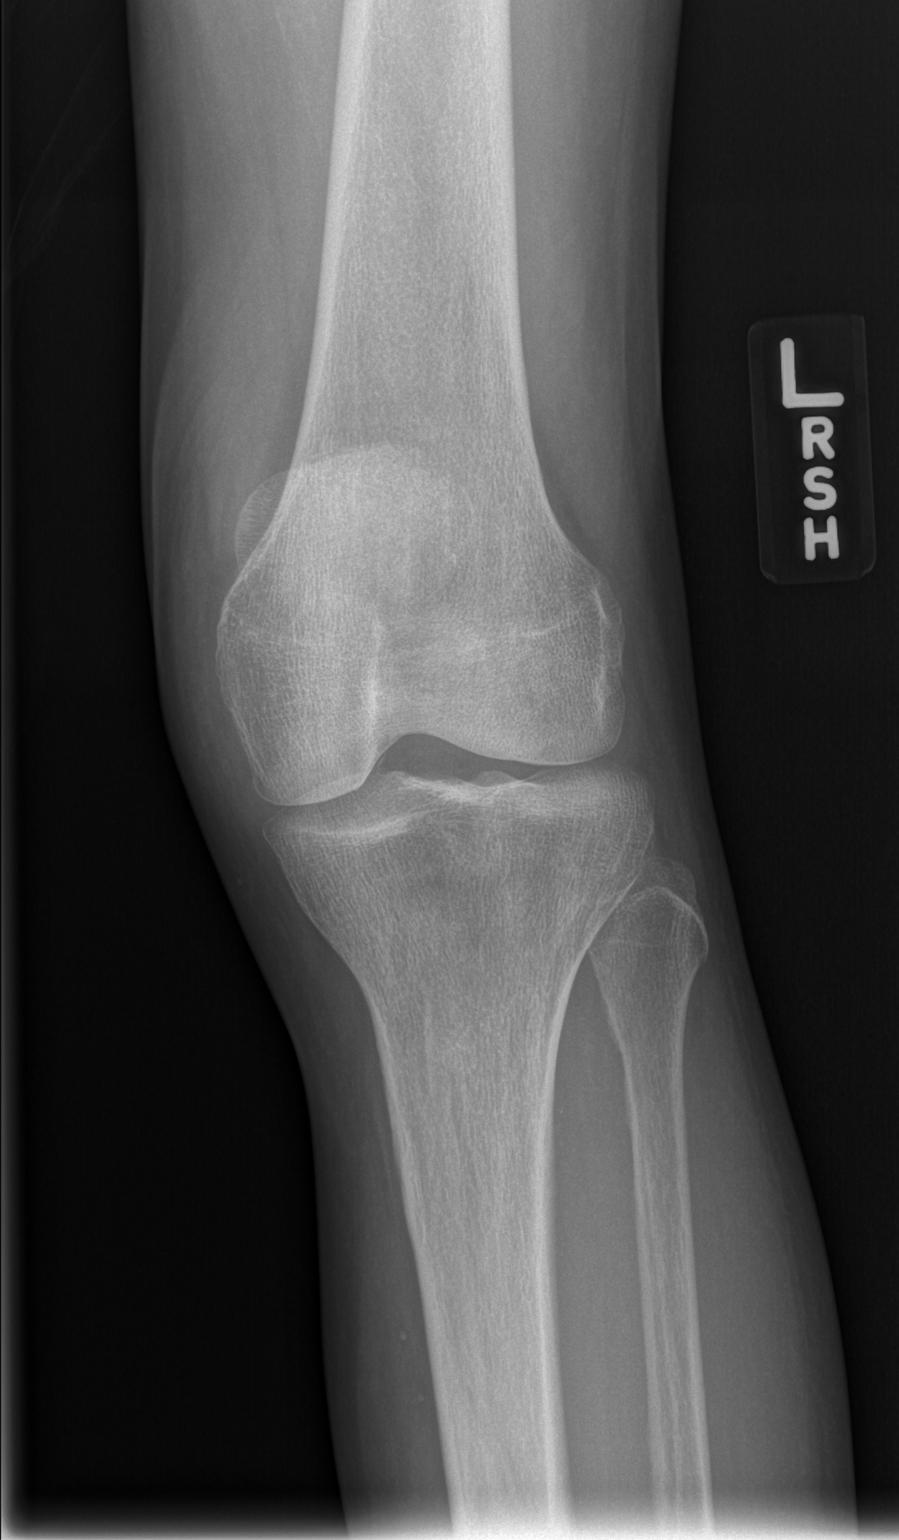

[t knee obl left (2 of 2)]
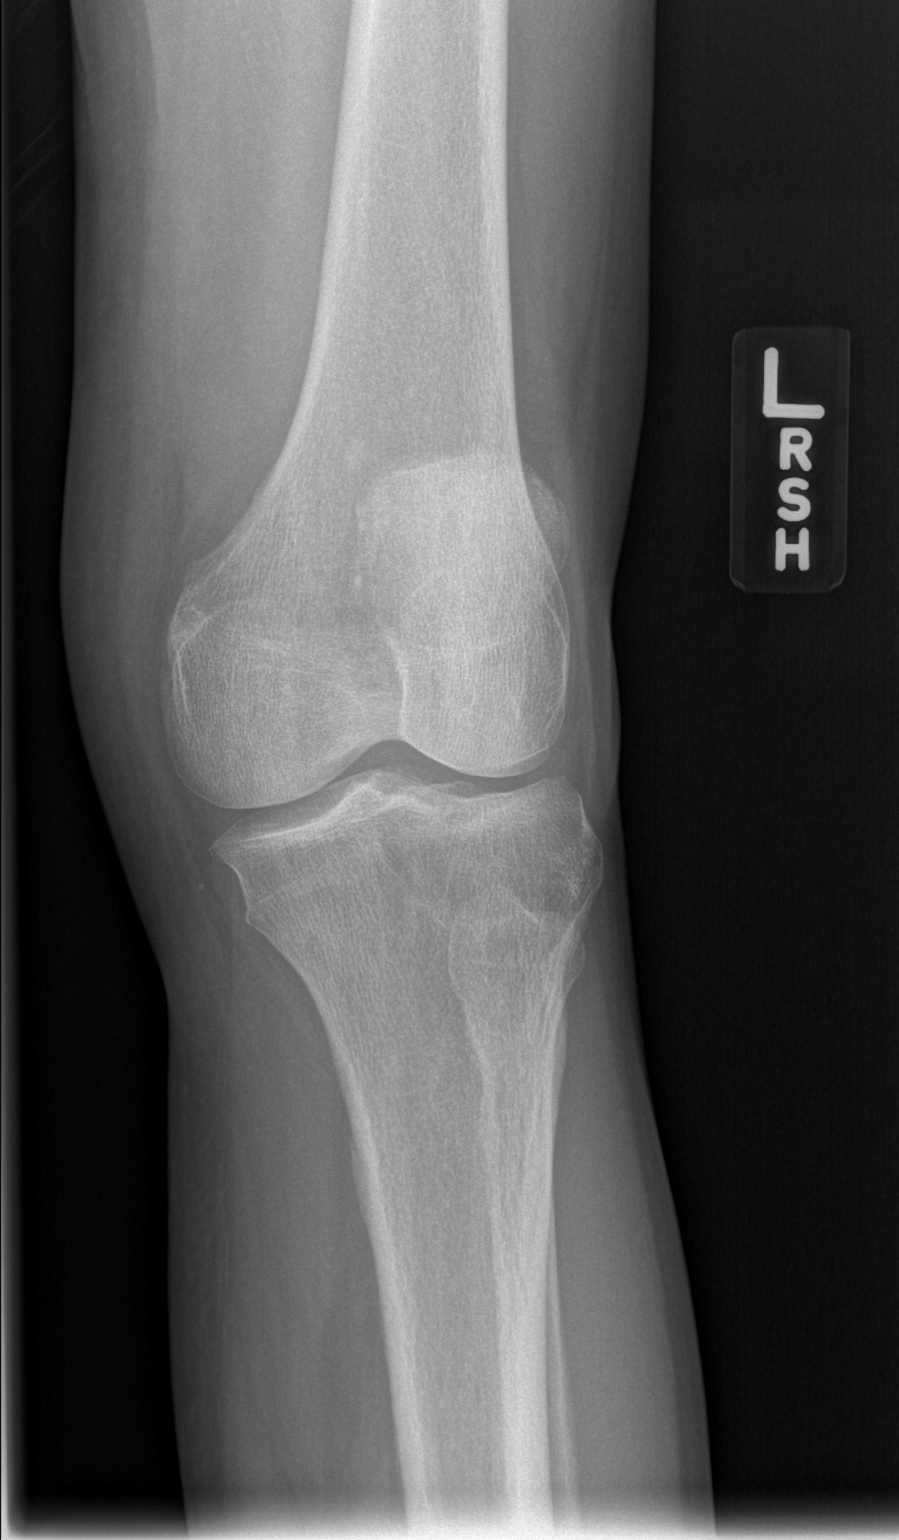

[t knee lat left]
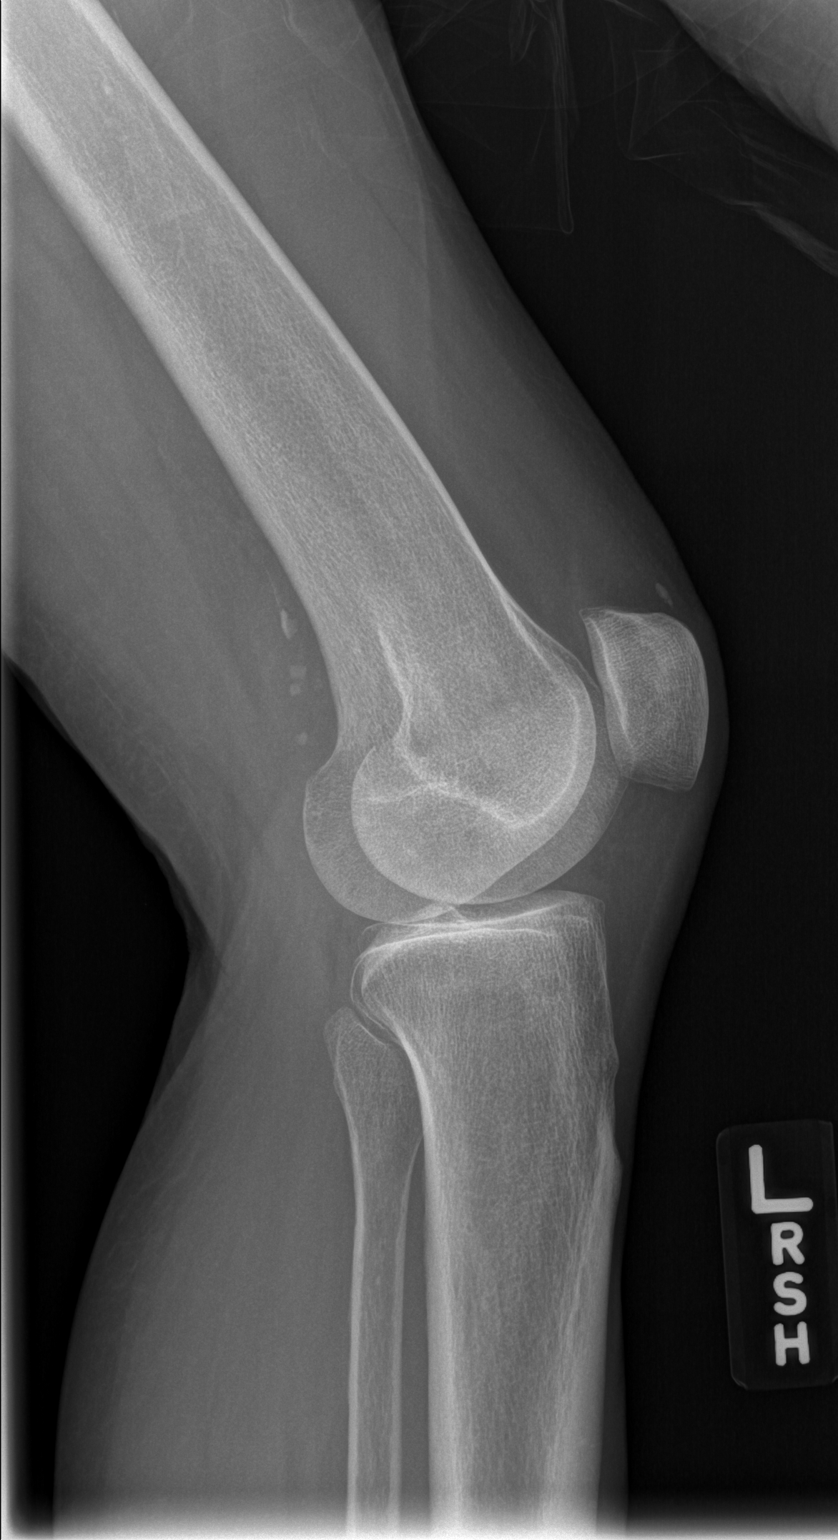

[4 of 4 positions shown; findings below may reference images not displayed]

FINDINGS: Osseous demineralization.

Minimal joint space narrowing.

No acute fracture, dislocation or bone destruction.

Scattered atherosclerotic calcifications.

No knee joint effusion.
IMPRESSION: Minimal degenerative changes without acute bony abnormalities.

## 2017-08-01 IMAGING — CT CT ORBITS W/O CM
3 of 12 series · 10 of 47 positions shown, 12 images · non-contrast
Comparison: HEAD CT WITHOUT CONTRAST 12/07/2014.

CLINICAL DATA: 69-year-old female with unwitnessed fall. Large
hematoma about the left eye. Unknown loss of consciousness. Initial
encounter.

EXAM:
CT HEAD WITHOUT CONTRAST
CT ORBITS WITHOUT CONTRAST
CT CERVICAL SPINE WITHOUT CONTRAST
TECHNIQUE: Multidetector CT imaging of the head, cervical spine, and orbital
structures were performed using the standard protocol without
intravenous contrast. Multiplanar CT image reconstructions of the
cervical spine and maxillofacial structures were also generated.

[Series 305: cor st · coronal · 0.32mm/px · 1 of 55 slices shown]
[im 28/55  bone]
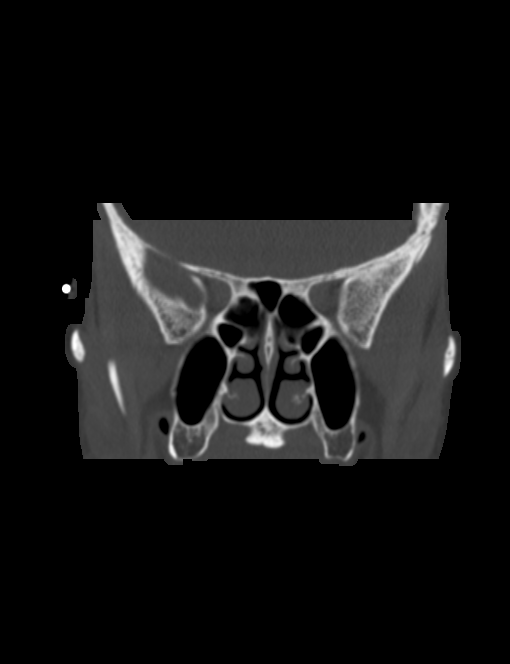

[Series 306: sag st · sagittal · 0.32mm/px · 1 of 69 slices shown]
[im 35/69  bone]
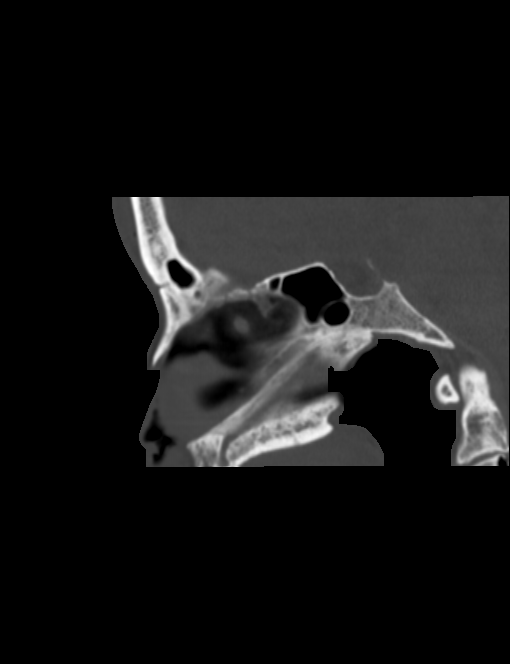

[Series 404: axial · axial · 0.26mm/px · z∈[+143,+270]mm · 8 of 91 slices shown, 10 images]
[im 11/91  brain]
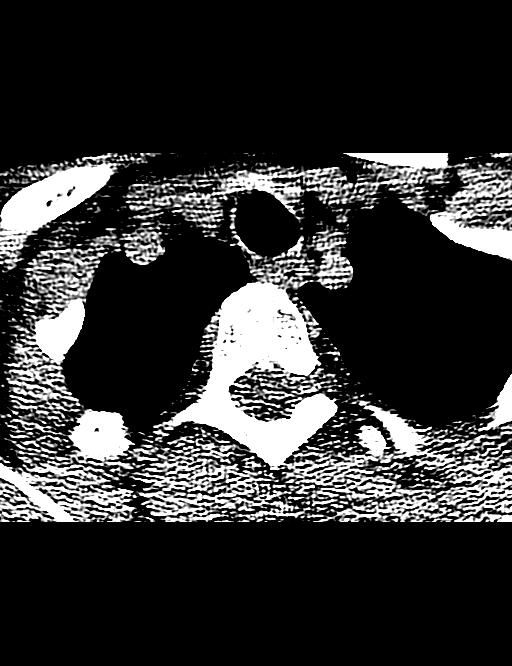
[im 11/91  bone]
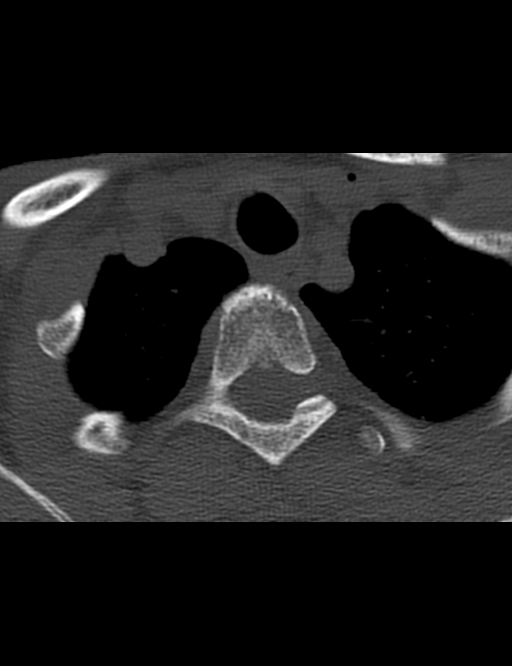
[im 21/91  bone]
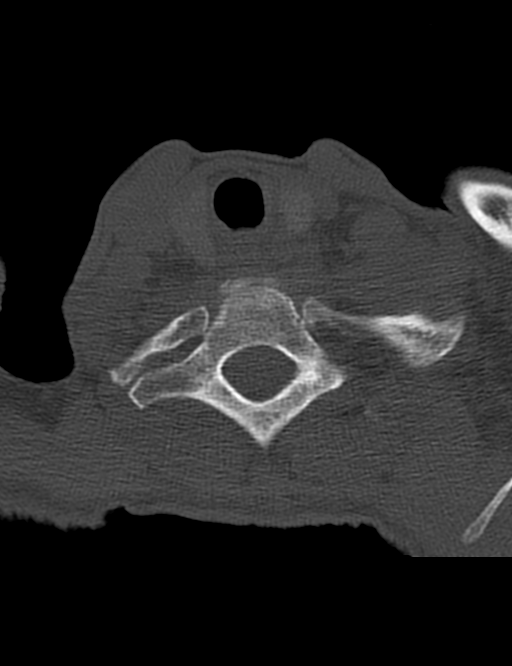
[im 31/91  bone]
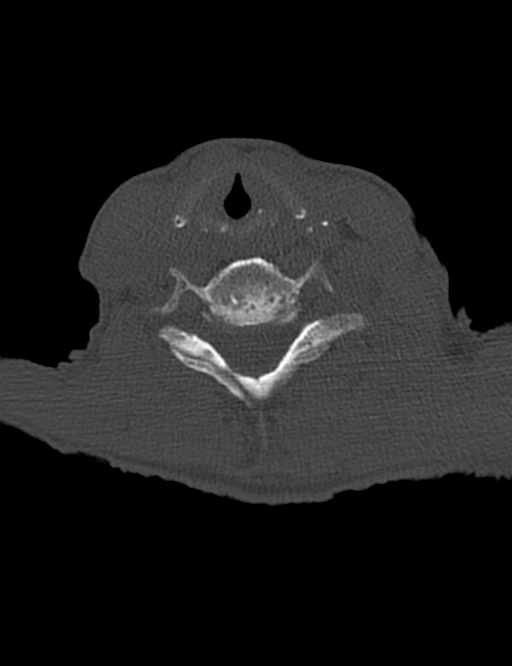
[im 41/91  bone]
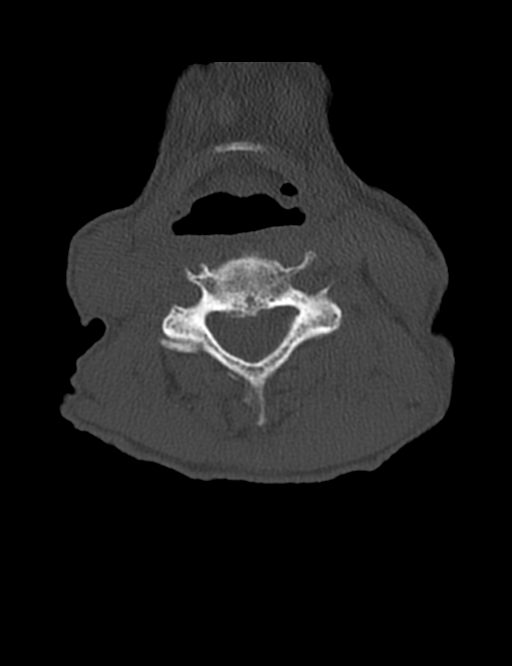
[im 51/91  brain]
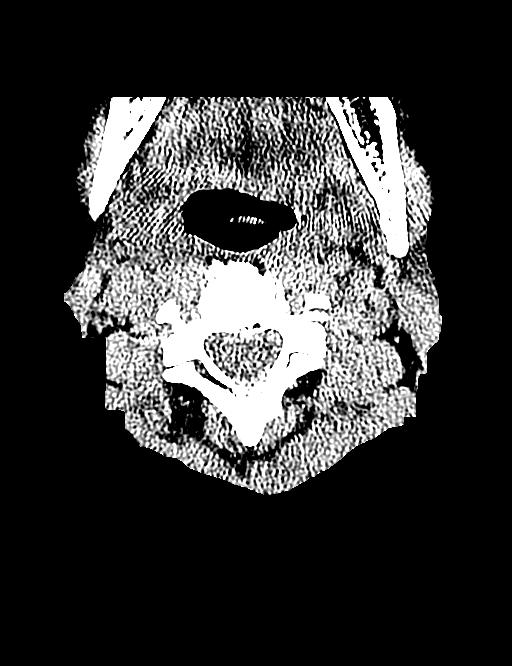
[im 51/91  bone]
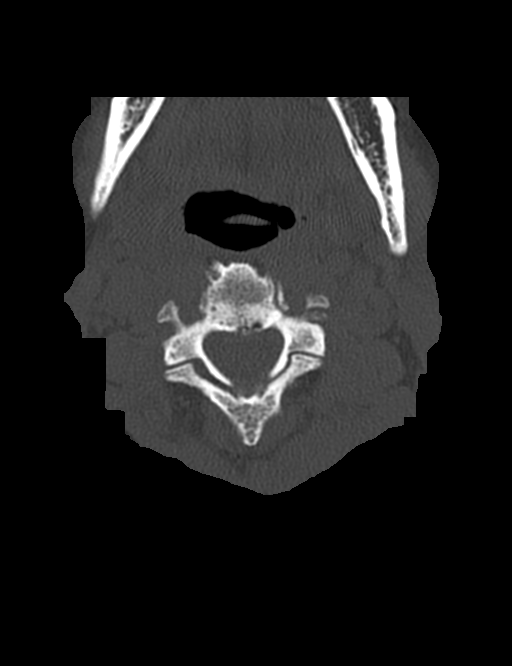
[im 61/91  bone]
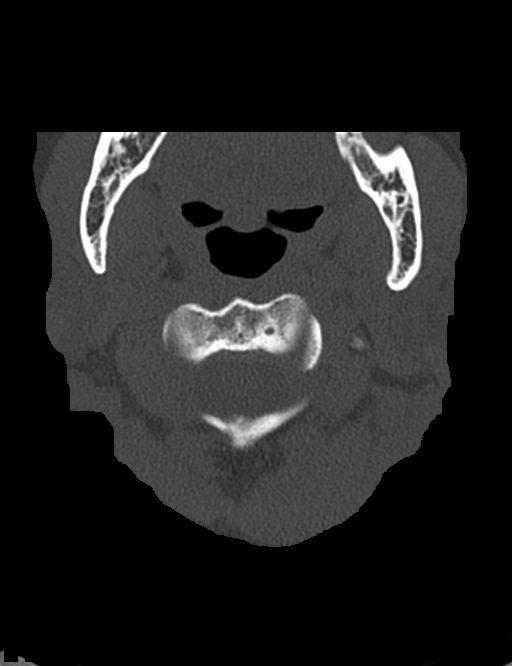
[im 71/91  bone]
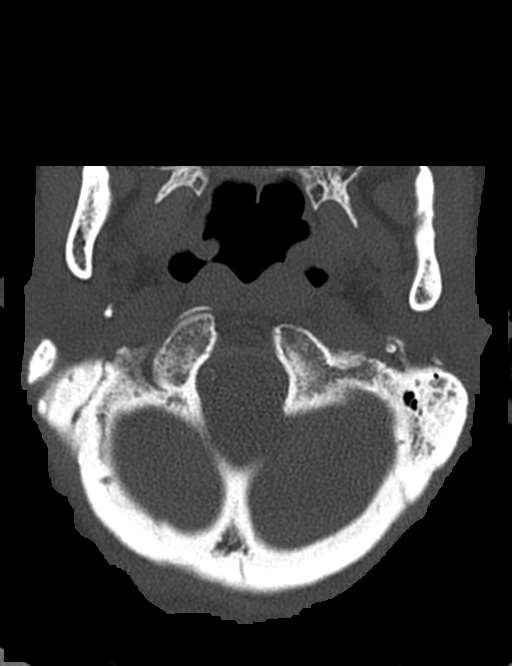
[im 81/91  bone]
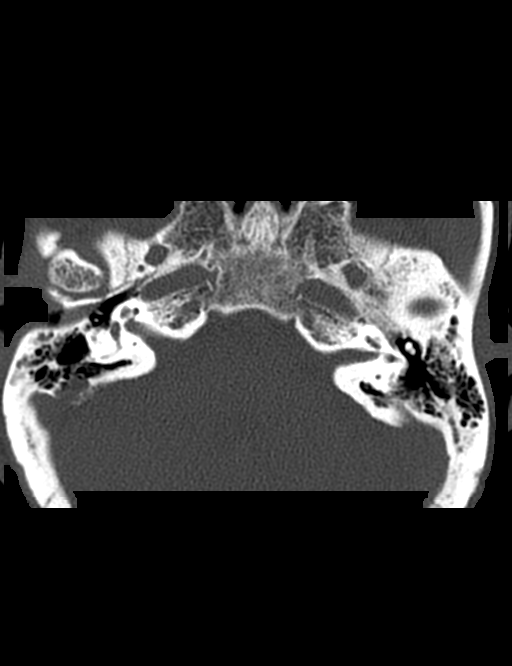

[10 of 47 positions shown; findings below may reference images not displayed]

FINDINGS: CT HEAD FINDINGS

Visualized paranasal sinuses and mastoids are clear. Large, 1.5 cm
thick broad-based left frontal convexity scalp hematoma is present
with a small volume of subcutaneous gas. Underlying frontal bones
are intact. Left frontal sinus is clear. See also orbit findings
below.

Other scalp soft tissues are within normal limits. No calvarium
fracture.

Stable cerebral volume. No ventriculomegaly. No midline shift, mass
effect, or evidence of intracranial mass lesion. No acute
intracranial hemorrhage identified. Patchy and confluent white
matter and deep gray matter hypodensity appears stable. No
cortically based acute infarct identified. Dystrophic basal ganglia
calcifications. No suspicious intracranial vascular hyperdensity.

CT ORBITS FINDINGS

Large broad-based left frontal region scalp hematoma tracks over the
left orbit. The left globe remains intact. No intraorbital hematoma
or contusion identified. Left orbital walls remain intact. Left
zygoma intact. No nasal bone fracture. Visible left maxilla intact.
Paranasal sinuses are clear aside from trace bilateral mucosal
thickening.

Negative right orbit soft tissues, sequelae of bilateral cataract
surgery incidentally noted. Negative visualized noncontrast deep
soft tissue spaces of the face. Bilateral EAC debris. There is mild
opacification of some inferior mastoid air cells.

CT CERVICAL SPINE FINDINGS

Visualized skull base is intact. No atlanto-occipital dissociation.
Degenerative ligamentous hypertrophy about the odontoid. C1-C2
alignment within normal limits, in those levels appear intact.
Cervicothoracic junction alignment is within normal limits.
Bilateral posterior element alignment is within normal limits.
Moderate to severe chronic disc and endplate degeneration from C2-C3
to C6-C7. Multifactorial at least moderate degenerative spinal
stenosis at C3-C4 (series 402, image 40). No cervical spine fracture
identified.

Grossly intact visualized upper thoracic levels. Negative lung
apices. Calcified carotid bifurcation atherosclerosis. Otherwise
negative noncontrast paraspinal soft tissues.
IMPRESSION: 1. Left scalp soft tissue injury with large superficial scalp and
periorbital hematoma. No intraorbital injury. No underlying
fracture.
2. No acute traumatic injury to the brain.
3. No acute fracture or listhesis identified in the cervical spine.
Ligamentous injury is not excluded.
4. Advanced cervical spine degeneration. At least moderate
multifactorial degenerative spinal stenosis suspected at C3-C4.

## 2018-11-04 DEATH — deceased
